# Patient Record
Sex: Male | Born: 1980 | Race: White | Hispanic: No | Marital: Single | Smoking: Never smoker
Health system: Southern US, Community
[De-identification: ages and names within clinical notes are randomized; demographics above are authoritative.]

## PROBLEM LIST (undated history)

## (undated) DIAGNOSIS — B019 Varicella without complication: Secondary | ICD-10-CM

## (undated) DIAGNOSIS — K921 Melena: Secondary | ICD-10-CM

## (undated) HISTORY — PX: WISDOM TOOTH EXTRACTION: SHX21

## (undated) HISTORY — DX: Melena: K92.1

## (undated) HISTORY — DX: Varicella without complication: B01.9

---

## 2015-08-25 ENCOUNTER — Encounter: Payer: Self-pay | Admitting: Family Medicine

## 2015-08-25 ENCOUNTER — Ambulatory Visit (INDEPENDENT_AMBULATORY_CARE_PROVIDER_SITE_OTHER): Payer: Managed Care, Other (non HMO) | Admitting: Family Medicine

## 2015-08-25 ENCOUNTER — Ambulatory Visit (INDEPENDENT_AMBULATORY_CARE_PROVIDER_SITE_OTHER)
Admission: RE | Admit: 2015-08-25 | Discharge: 2015-08-25 | Disposition: A | Discharge status: Home / Self Care | Payer: Managed Care, Other (non HMO) | Source: Ambulatory Visit | Attending: Family Medicine | Admitting: Family Medicine

## 2015-08-25 VITALS — BP 120/78 | HR 72 | Wt 189.0 lb

## 2015-08-25 DIAGNOSIS — M5416 Radiculopathy, lumbar region: Secondary | ICD-10-CM

## 2015-08-25 MED ORDER — PREDNISONE 50 MG PO TABS: 50.0000 mg | ORAL_TABLET | Freq: Every day | ORAL | Status: DC

## 2015-08-25 MED ORDER — GABAPENTIN 100 MG PO CAPS: 200.0000 mg | ORAL_CAPSULE | Freq: Every day | ORAL | Status: DC

## 2015-08-25 NOTE — Assessment & Plan Note (Signed)
Patient is somewhat of a lumbar radiculopathy. Was given prednisone as well as gabapentin. X-rays pending. We discussed range of motion exercises and patient wear athletic trainer to learn him in greater detail. We discussed this worsening symptoms patient is a seek medical attention immediately. Patient otherwise will come back in 2-3 weeks for further evaluation and treatment.

## 2015-08-25 NOTE — Progress Notes (Signed)
Tawana ScaleZach Burnie Hank D.O. [facility] [address] [place], [place] [address] Phone: [phone] Subjective:    CC: Low back pain with radiation down the right leg  [name]:Subjective Philip Diaz is a 35 y.o. male coming in with complaint of backpain. Patient states and he has had this intermittent for approximately 1 year. Over the course last several weeks on seems to be worsening. Patient to the point is unable to work down her doing any of his activities outside of regular daily activities. Patient states that he is also noticing a dull throbbing aching pain that seems to be going down the posterior lateral aspects of the right leg. Disc of past his knee. States that now it seems to be more constant. Denies any numbness associated with it or any type or weakness. Patient states if he moves in certain directions though there is almost a burning sensation in the leg. Denies any bowel or bladder problems. Denies any fevers chills or any abnormal weight loss. Rates the severity of pain a 7 out of 10. Does respond to ibuprofen but he is taking a very regularly.     Past Medical History  Diagnosis Date  . Chicken pox   . Blood in stool    Past Surgical History  Procedure Laterality Date  . Wisdom tooth extraction Bilateral     1999   Social History   Social History  . Marital Status: Single    Spouse Name: N/A  . Number of Children: N/A  . Years of Education: N/A   Social History Main Topics  . Smoking status: Not on file  . Smokeless tobacco: Never Used  . Alcohol Use: Yes  . Drug Use: No  . Sexual Activity: Not on file   Other Topics Concern  . Not on file   Social History Narrative  . No narrative on file   Allergies not on file Family History  Problem Relation Age of Onset  . Cancer Mother   . Hypertension Mother   . Cancer Father   . Hypertension Father   . Alcohol abuse Maternal Grandmother   . Alcohol abuse Maternal Grandfather   . Alcohol abuse  Paternal Grandmother   . Alcohol abuse Paternal Grandfather     Past medical history, social, surgical and family history all reviewed in electronic medical record.  No pertanent information unless stated regarding to the chief complaint.   Review of Systems: No headache, visual changes, nausea, vomiting, diarrhea, constipation, dizziness, abdominal pain, skin rash, fevers, chills, night sweats, weight loss, swollen lymph nodes, body aches, joint swelling, muscle aches, chest pain, shortness of breath, mood changes.   Objective Blood pressure 120/78, pulse 72, weight 189 lb (85.73 kg).  General: No apparent distress alert and oriented x3 mood and affect normal, dressed appropriately.  HEENT: Pupils equal, extraocular movements intact  Respiratory: Patient's speak in full sentences and does not appear short of breath  Cardiovascular: No lower extremity edema, non tender, no erythema  Skin: Warm dry intact with no signs of infection or rash on extremities or on axial skeleton.  Abdomen: Soft nontender  Neuro: Cranial nerves II through XII are intact, neurovascularly intact in all extremities with 2+ DTRs and 2+ pulses.  Lymph: No lymphadenopathy of posterior or anterior cervical chain or axillae bilaterally.  Gait normal with good balance and coordination.  MSK:  Non tender with full range of motion and good stability and symmetric strength and tone of shoulders, elbows, wrist,  knee and  ankles bilaterally.  Back Exam:  Inspection: Unremarkable  Motion: Flexion 25 deg, Extension 25 deg, Side Bending to 35 deg bilaterally,  Rotation to 45 deg bilaterally  SLR laying: Positive right side XSLR laying: Positive with pain on the right side Palpable tenderness: Tender over the paraspinal musculature on the L4-L5 on the right side. FABER: negative. Sensory change: Gross sensation intact to all lumbar and sacral dermatomes.  Reflexes: 2+ at both patellar tendons, 2+ at achilles tendons, Babinski's  downgoing.  Strength at foot  Plantar-flexion: 5/5 Dorsi-flexion: 5/5 Eversion: 5/5 Inversion: 5/5  Leg strength  Quad: 5/5 Hamstring: 5/5 Hip flexor: 5/5 Hip abductors: 4/5 but symmetric Gait unremarkable.  Procedure note 97110; 15 minutes spent for Therapeutic exercises as stated in above notes.  This included exercises focusing on stretching, strengthening, with significant focus on eccentric aspects.  Low back exercises that included:  Pelvic tilt/bracing instruction to focus on control of the pelvic girdle and lower abdominal muscles  Glute strengthening exercises, focusing on proper firing of the glutes without engaging the low back muscles Proper stretching techniques for maximum relief for the hamstrings, hip flexors, low back and some rotation where tolerated Proper technique shown and discussed handout in great detail with ATC.  All questions were discussed and answered.     Impression and Recommendations:     This case required medical decision making of moderate complexity.      Note: This dictation was prepared with Dragon dictation along with smaller phrase technology. Any transcriptional errors that result from this process are unintentional.

## 2015-08-25 NOTE — Patient Instructions (Signed)
Goo see you Ice is your friend Ice 20 minutes 2 times daily. Usually after activity and before bed. Exercises 3 times a week.  If any worsening pain then stop Prednisone daily for 5 days Gabapentin 100mg  for 3 nights then 200mg  thereafter.  Avoid any running or jumping for sure See me again in within 2-3 weeks and we will see how you are doing and if we need anything likel manipulation or consider physical therapy.

## 2015-09-14 ENCOUNTER — Encounter: Payer: Self-pay | Admitting: Family Medicine

## 2015-09-18 ENCOUNTER — Ambulatory Visit (INDEPENDENT_AMBULATORY_CARE_PROVIDER_SITE_OTHER): Payer: Managed Care, Other (non HMO) | Admitting: Family Medicine

## 2015-09-18 VITALS — BP 122/80 | HR 68 | Wt 187.0 lb

## 2015-09-18 DIAGNOSIS — M9902 Segmental and somatic dysfunction of thoracic region: Secondary | ICD-10-CM | POA: Diagnosis not present

## 2015-09-18 DIAGNOSIS — M9903 Segmental and somatic dysfunction of lumbar region: Secondary | ICD-10-CM | POA: Diagnosis not present

## 2015-09-18 DIAGNOSIS — M5416 Radiculopathy, lumbar region: Secondary | ICD-10-CM

## 2015-09-18 DIAGNOSIS — M9904 Segmental and somatic dysfunction of sacral region: Secondary | ICD-10-CM | POA: Diagnosis not present

## 2015-09-18 DIAGNOSIS — M999 Biomechanical lesion, unspecified: Secondary | ICD-10-CM | POA: Insufficient documentation

## 2015-09-18 MED ORDER — GABAPENTIN 300 MG PO CAPS: 300.0000 mg | ORAL_CAPSULE | Freq: Every day | ORAL | Status: DC

## 2015-09-18 MED ORDER — TIZANIDINE HCL 4 MG PO TABS: 4.0000 mg | ORAL_TABLET | Freq: Three times a day (TID) | ORAL | Status: DC | PRN

## 2015-09-18 NOTE — Patient Instructions (Addendum)
Good to see you  Ice is your friend  Increase gabapentin to 300mg  at night Zanaflex up to 2 times daily as needed as a muscle relaxer Physical therapy will be calling you and go as much as you feel it is needed.  Tried manipulation and will see if it helps Send me a message in 10 days and see me again in 3 weeks.  We may need to consider MRI or a EMG.

## 2015-09-18 NOTE — Progress Notes (Signed)
Tawana ScaleZach Smith D.O. [facility] [address] [place], [place] [address] Phone: [phone] Subjective:    CC: Low back pain with radiation down the right leg follow-up  [name]:Subjective Philip Diaz is a 35 y.o. male coming in with complaint of backpain. Patient was seen previously and was having signs of radicular symptoms. Concern for lumbar radiculopathy. Was sent for x-rays that were independently visualized by me and showed no significant bony normality. Patient states that the prednisone may have helped some but once he was off the medication it seemed to come back. Mild improvement with the gabapentin. Has not tried any of the other over-the-counter medicines. When he tries to do the piriformis stretch seems to worsen the radiation to the lateral aspect awake around the ankle. Has not been working out regularly secondary to the pain.     Past Medical History  Diagnosis Date  . Chicken pox   . Blood in stool    Past Surgical History  Procedure Laterality Date  . Wisdom tooth extraction Bilateral     1999   Social History   Social History  . Marital Status: Single    Spouse Name: N/A  . Number of Children: N/A  . Years of Education: N/A   Social History Main Topics  . Smoking status: Not on file  . Smokeless tobacco: Never Used  . Alcohol Use: Yes  . Drug Use: No  . Sexual Activity: Not on file   Other Topics Concern  . Not on file   Social History Narrative  . No narrative on file   Not on File Family History  Problem Relation Age of Onset  . Cancer Mother   . Hypertension Mother   . Cancer Father   . Hypertension Father   . Alcohol abuse Maternal Grandmother   . Alcohol abuse Maternal Grandfather   . Alcohol abuse Paternal Grandmother   . Alcohol abuse Paternal Grandfather     Past medical history, social, surgical and family history all reviewed in electronic medical record.  No pertanent information unless stated regarding to the chief  complaint.   Review of Systems: No headache, visual changes, nausea, vomiting, diarrhea, constipation, dizziness, abdominal pain, skin rash, fevers, chills, night sweats, weight loss, swollen lymph nodes, body aches, joint swelling, muscle aches, chest pain, shortness of breath, mood changes.   Objective There were no vitals taken for this visit.  General: No apparent distress alert and oriented x3 mood and affect normal, dressed appropriately.  HEENT: Pupils equal, extraocular movements intact  Respiratory: Patient's speak in full sentences and does not appear short of breath  Cardiovascular: No lower extremity edema, non tender, no erythema  Skin: Warm dry intact with no signs of infection or rash on extremities or on axial skeleton.  Abdomen: Soft nontender  Neuro: Cranial nerves II through XII are intact, neurovascularly intact in all extremities with 2+ DTRs and 2+ pulses.  Lymph: No lymphadenopathy of posterior or anterior cervical chain or axillae bilaterally.  Gait normal with good balance and coordination.  MSK:  Non tender with full range of motion and good stability and symmetric strength and tone of shoulders, elbows, wrist,  knee and ankles bilaterally.  Back Exam:  Inspection: Unremarkable  Motion: Flexion 25 deg, Extension 25 deg, Side Bending to 35 deg bilaterally,  Rotation to 45 deg bilaterally  SLR laying: Positive right side still present with mild worsening XSLR laying: Positive with pain on the right side Palpable tenderness: Tender over the  paraspinal musculature on the L4-L5 on the right side. FABER: negative. Sensory change: Gross sensation intact to all lumbar and sacral dermatomes.  Reflexes: 2+ at both patellar tendons, 2+ at achilles tendons, Babinski's downgoing.  Strength at foot  Strength of the foot seems 4+ on the right side compared to 5 out of 5 strength on the left Leg strength  Quad: 5/5 Hamstring: 5/5 Hip flexor: 5/5 Hip abductors: 4/5 but  symmetric Gait unremarkable.  Osteopathic findings T3 extended rotated and side bent right L2 flexed rotated and side bent right Sacrum left on left    Impression and Recommendations:     This case required medical decision making of moderate complexity.      Note: This dictation was prepared with Dragon dictation along with smaller phrase technology. Any transcriptional errors that result from this process are unintentional.

## 2015-09-18 NOTE — Assessment & Plan Note (Signed)
Continues to have signs and symptoms of radiculopathy. Visit secondary to compression at the piriformis muscle or from the back itself. X-rays were normal. Discussed with patient about possible injection of the piriformis  INCREASE GABAPENTIN TO 300 MG. MUSCLE RELAXER PRESCRIBED. ENCOURAGE PATIENT TO DO MORE THE EXERCISES AND HE DECLINED FORMAL PHYSICAL THERAPY TODAY. PATIENT DID ATTEMPT TO RESPOND TO OSTEOPATHIC MANIPULATION. FOLLOW-UP AGAIN IN 4 WEEKS

## 2015-09-18 NOTE — Assessment & Plan Note (Signed)
Decision today to treat with OMT was based on Physical Exam  After verbal consent patient was treated with HVLA, ME techniques in thoracic, lumbar and sacral areas  Patient tolerated the procedure well with improvement in symptoms  Patient given exercises, stretches and lifestyle modifications  See medications in patient instructions if given  Patient will follow up in 4 weeks    

## 2015-09-21 ENCOUNTER — Ambulatory Visit: Payer: Managed Care, Other (non HMO) | Attending: Family Medicine | Admitting: Physical Therapy

## 2015-09-21 DIAGNOSIS — M6281 Muscle weakness (generalized): Secondary | ICD-10-CM | POA: Diagnosis present

## 2015-09-21 DIAGNOSIS — M5441 Lumbago with sciatica, right side: Secondary | ICD-10-CM | POA: Diagnosis present

## 2015-09-21 NOTE — Therapy (Signed)
[facility] [address] [place], [place], [address] Phone: [phone]   Fax:  [phone]  Physical Therapy Evaluation  Patient Details  Name: Philip OxfordMarshall Oats MRN: [id number] Date of Birth: September 06, 1980 Referring Provider: Terrilee Filessmith, zach  Encounter Date: 09/21/2015      PT End of Session - 09/21/15 0837    Visit Number 1   Date for PT Re-Evaluation 11/16/15   PT Start Time 0800   PT Stop Time 0837   PT Time Calculation (min) 37 min   Activity Tolerance Patient tolerated treatment well   Behavior During Therapy [facility] for tasks assessed/performed      Past Medical History  Diagnosis Date  . Chicken pox   . Blood in stool     Past Surgical History  Procedure Laterality Date  . Wisdom tooth extraction Bilateral     1999    There were no vitals filed for this visit.       Subjective Assessment - 09/21/15 0805    Pertinent History many instances of low back strains            [facility] PT Assessment - 09/21/15 0001    Assessment   Medical Diagnosis low back pain with sciatica   Referring Provider Terrilee Filessmith, zach   Next MD Visit july 24   Prior Therapy pt given HEP for stretching and strengthening by MD   Precautions   Precautions None   Restrictions   Weight Bearing Restrictions No   Balance Screen   Has the patient fallen in the past 6 months No   Prior Function   Level of Independence Independent   Observation/Other Assessments   Focus on Therapeutic Outcomes (FOTO)  49% limited   ROM / Strength   AROM / PROM / Strength AROM;Strength   AROM   Overall AROM Comments lumbar flexion limited 50%, all other motions [facility]   Strength   Overall Strength Comments hip flex, abd, ext all 4/5 on Rt, multifidus 3/5   Palpation   SI assessment  tightness bilat with PA glides   Palpation comment no tightness palpable in piriformis bilat   Special Tests    Special Tests Lumbar   Lumbar Tests FABER test;Slump  Test;Straight Leg Raise   FABER test   findings Negative   Side --  bilat   Slump test   Findings Positive   Side --  bilat   Straight Leg Raise   Findings Positive   Side  Right   Ambulation/Gait   Ambulation/Gait --  antalgic gait with decreased knee flex on Rt                   [facility] Adult PT Treatment/Exercise - 09/21/15 0001    Manual Therapy   Manual Therapy Joint mobilization   Joint Mobilization SI jt grade II-III for decreasing pain and increasing mobility                PT Education - 09/21/15 0834    Education provided Yes   Education Details HEP for nerve glides for sciatic and piriformis stretch.   Person(s) Educated Patient   Methods Explanation;Demonstration;Handout   Comprehension Verbalized understanding;Returned demonstration          PT Short Term Goals - 09/21/15 0840    PT SHORT TERM GOAL #1   Title pt will be independent with initial HEP   Time 4   Period Weeks   Status New   PT SHORT TERM  GOAL #2   Title Pt will report 25% reduction in pain with getting in/out of car   Time 4   Period Weeks   Status New   PT SHORT TERM GOAL #3   Title Pt will improve lumbar flexion ROM by 25%   Time 4   Period Weeks   Status New           PT Long Term Goals - 09/21/15 0841    PT LONG TERM GOAL #1   Title Pt will improve FOTO to <30% to demonstrate improved functional mobility   Time 8   Period Weeks   Status New   PT LONG TERM GOAL #2   Title Pt will be independent with advanced HEP   Time 8   Period Weeks   Status New   PT LONG TERM GOAL #3   Title Pt will reduce pain by 50% with daily activities   Time 8   Period Weeks   Status New   PT LONG TERM GOAL #4   Title Pt will demo core and multifidus strength 4+/5 to improve ability to perform daily activities with decreased pain   Time 8   Period Weeks   Status New               Plan - 09/21/15 1610    Clinical Impression Statement Pt presents with burning  pain down Rt LE with flexion movements which limit his ability to perform daily tasks without pain. Pt will benefit from skilled PT to address deficits and improve pain free functional mobility.   Rehab Potential Good   PT Frequency 2x / week   PT Duration 8 weeks   PT Treatment/Interventions Iontophoresis /ml Dexamethasone;ADLs/Self Care Home Management;Electrical Stimulation;DME Instruction;Cryotherapy;Biofeedback;Traction;Ultrasound;Moist Heat;Functional mobility training;Gait training;Therapeutic activities;Therapeutic exercise;Balance training;Patient/family education;Taping;Dry needling;Manual techniques;Passive range of motion   PT Next Visit Plan assess HEP, jt mobilitzation, core/back strengthening   PT Home Exercise Plan piriformis stretch, nerve glides   Consulted and Agree with Plan of Care Patient      Patient will benefit from skilled therapeutic intervention in order to improve the following deficits and impairments:  Pain, Hypomobility, Difficulty walking, Decreased strength  Visit Diagnosis: Right-sided low back pain with right-sided sciatica - Plan: PT plan of care cert/re-cert  Muscle weakness (generalized) - Plan: PT plan of care cert/re-cert     Problem List Patient Active Problem List   Diagnosis Date Noted  . Nonallopathic lesion of thoracic region 09/18/2015  . Nonallopathic lesion of lumbar region 09/18/2015  . Nonallopathic lesion of sacral region 09/18/2015  . Lumbar radiculopathy 08/25/2015    Reggy Eye, PT, DPT  09/21/2015, 8:46 AM  [facility] [address] [place], [place], [address] Phone: [phone]   Fax:  [phone]  Name: Augustino Savastano MRN: [id number] Date of Birth: [birth year]

## 2015-09-26 ENCOUNTER — Ambulatory Visit: Payer: Managed Care, Other (non HMO)

## 2015-09-26 DIAGNOSIS — M5441 Lumbago with sciatica, right side: Secondary | ICD-10-CM | POA: Diagnosis not present

## 2015-09-26 DIAGNOSIS — M6281 Muscle weakness (generalized): Secondary | ICD-10-CM

## 2015-09-26 NOTE — Patient Instructions (Signed)
On Elbows (Prone)  Rise up on elbows as high as possible, keeping hips on floor. Hold 1-5 minutes  Do _3-5___ sessions per day.  Press-Up  Press upper body upward, keeping hips in contact with floor. Keep lower back and buttocks relaxed. Hold _3-5___ seconds. Repeat ____ times per set. Do ____ sessions per day.                     Backward Bend (Standing)   Arch backward to make hollow of back deeper. Hold _5___ seconds. Repeat __10__ times per set. Do ____ sessions per day. Copyright  [employer]. All rights reserved.    If you have back pain and the pain travels to your hips and/or legs, please STOP the exercises  If you already have leg pain and the leg pain gets worse, STOP and let your therapist know at your next visit  Occasionally, when you abolish leg pain, you may have a temporary increase in low back pain.  This can be a normal reaction and the back pain should eventually get better.  However, if the pain is too significant to exercise, please STOP the exercises and let your therapist know at your next visit.     Lifting Principles  .Maintain proper posture and head alignment. .Slide object as close as possible before lifting. .Move obstacles out of the way. .Test before lifting; ask for help if too heavy. .Tighten stomach muscles without holding breath. .Use smooth movements; do not jerk. .Use legs to do the work, and pivot with feet. .Distribute the work load symmetrically and close to the center of trunk. .Push instead of pull whenever possible.   Squat down and hold basket close to stand. Use leg muscles to do the work.    Avoid twisting or bending back. Pivot around using foot movements, and bend at knees if needed when reaching for articles.        Getting Into / Out of Bed   Lower self to lie down on one side by raising legs and lowering head at the same time. Use arms to assist moving without twisting. Bend both knees to roll onto  back if desired. To sit up, start from lying on side, and use same move-ments in reverse. Keep trunk aligned with legs.    Shift weight from front foot to back foot as item is lifted off shelf.    When leaning forward to pick object up from floor, extend one leg out behind. Keep back straight. Hold onto a sturdy support with other hand.      Sit upright, head facing forward. Try using a roll to support lower back. Keep shoulders relaxed, and avoid rounded back. Keep hips level with knees. Avoid crossing legs for long periods.    [facility] [address], [address] [place], [place] [address] Phone # [phone] Fax [phone]

## 2015-09-26 NOTE — Therapy (Signed)
[facility] [address], [address] [place], [place], [address] Phone: [phone]   Fax:  [phone]  Physical Therapy Treatment  Patient Details  Name: Philip Diaz MRN: [id number] Date of Birth: 1980/08/06 Referring Provider: Terrilee Files  Encounter Date: 09/26/2015      PT End of Session - 09/26/15 0751    Visit Number 2   Date for PT Re-Evaluation 11/16/15   PT Start Time 0730   PT Stop Time 0820   PT Time Calculation (min) 50 min   Activity Tolerance Patient tolerated treatment well   Behavior During Therapy [facility] for tasks assessed/performed      Past Medical History  Diagnosis Date  . Chicken pox   . Blood in stool     Past Surgical History  Procedure Laterality Date  . Wisdom tooth extraction Bilateral     1999    There were no vitals filed for this visit.      Subjective Assessment - 09/26/15 0734    Subjective Doing OK with exercise.   Currently in Pain? Yes   Pain Score 7    Pain Location Leg   Pain Orientation Right   Pain Descriptors / Indicators Burning   Pain Radiating Towards Rt LE    Pain Onset More than a month ago   Pain Frequency Constant   Aggravating Factors  stretching, putting on shoes and socks on, in/out of car, sit to stand   Pain Relieving Factors meds                         [facility] Adult PT Treatment/Exercise - 09/26/15 0001    Exercises   Exercises Lumbar   Lumbar Exercises: Standing   Other Standing Lumbar Exercises standing extension x10   Lumbar Exercises: Prone   Straight Leg Raise 20 reps   Other Prone Lumbar Exercises prone on elbows, prone press ups   Modalities   Modalities Traction   Traction   Type of Traction Lumbar   Min (lbs) 50   Max (lbs) 90   Hold Time 60   Rest Time 20   Time 15                PT Education - 09/26/15 0750    Education provided Yes   Education Details [employer], [employer] operational officer)  Educated Patient   Methods Explanation;Demonstration;Handout   Comprehension Verbalized understanding;Returned demonstration          PT Short Term Goals - 09/26/15 0739    PT SHORT TERM GOAL #1   Title pt will be independent with initial HEP   Time 4   Period Weeks   Status On-going   PT SHORT TERM GOAL #2   Title Pt will report 25% reduction in pain with getting in/out of car   Time 4   Period Weeks   Status On-going   PT SHORT TERM GOAL #3   Title Pt will improve lumbar flexion ROM by 25%   Time 4   Period Weeks   Status On-going           PT Long Term Goals - 09/21/15 0841    PT LONG TERM GOAL #1   Title Pt will improve FOTO to <30% to demonstrate improved functional mobility   Time 8   Period Weeks   Status New   PT LONG TERM GOAL #2   Title Pt will be independent with advanced  HEP   Time 8   Period Weeks   Status New   PT LONG TERM GOAL #3   Title Pt will reduce pain by 50% with daily activities   Time 8   Period Weeks   Status New   PT LONG TERM GOAL #4   Title Pt will demo core and multifidus strength 4+/5 to improve ability to perform daily activities with decreased pain   Time 8   Period Weeks   Status New               Plan - 09/26/15 0740    Clinical Impression Statement Pt with only 1 session after evaluation.  Pt is independent in initial HEP for gluteal flexiblity and nerve glides.  PT tried traction today and will assess response next session.  Pt with continued Rt gluteal and LE radiculopathy and will continue to benefit from skilled PT for traction, flexiblity and manual  therapy to Rt gluteals.     Rehab Potential Good   PT Frequency 2x / week   PT Duration 8 weeks   PT Treatment/Interventions Iontophoresis 4mg /ml Dexamethasone;ADLs/Self Care Home Management;Electrical Stimulation;DME Instruction;Cryotherapy;Biofeedback;Traction;Ultrasound;Moist Heat;Functional mobility training;Gait training;Therapeutic activities;Therapeutic  exercise;Balance training;Patient/family education;Taping;Dry needling;Manual techniques;Passive range of motion   PT Next Visit Plan assess HEP, jt mobilitzation, core/back strengthening, continue traction if helpful   Consulted and Agree with Plan of Care Patient      Patient will benefit from skilled therapeutic intervention in order to improve the following deficits and impairments:  Pain, Hypomobility, Difficulty walking, Decreased strength  Visit Diagnosis: Right-sided low back pain with right-sided sciatica  Muscle weakness (generalized)     Problem List Patient Active Problem List   Diagnosis Date Noted  . Nonallopathic lesion of thoracic region 09/18/2015  . Nonallopathic lesion of lumbar region 09/18/2015  . Nonallopathic lesion of sacral region 09/18/2015  . Lumbar radiculopathy 08/25/2015     Lorrene ReidKelly Tanis Burnley, PT 09/26/2015 8:03 AM  [facility] [address], [address] [place], [place], [address] Phone: [phone]   Fax:  [phone]  Name: Philip OxfordMarshall Mosley MRN: [id number] Date of Birth: [birth year]

## 2015-09-28 ENCOUNTER — Ambulatory Visit: Payer: Managed Care, Other (non HMO)

## 2015-09-28 DIAGNOSIS — M5441 Lumbago with sciatica, right side: Secondary | ICD-10-CM

## 2015-09-28 DIAGNOSIS — M6281 Muscle weakness (generalized): Secondary | ICD-10-CM

## 2015-09-28 NOTE — Therapy (Signed)
[facility] [address], [address] [place], [place], [address] Phone: [phone]   Fax:  [phone]  Physical Therapy Treatment  Patient Details  Name: Philip OxfordMarshall Haak MRN: [id number] Date of Birth: 10-Dec-1980 Referring Provider: Terrilee Filessmith, zach  Encounter Date: 09/28/2015      PT End of Session - 09/28/15 0802    Visit Number 3   Date for PT Re-Evaluation 11/16/15   PT Start Time 0732   PT Stop Time 0820   PT Time Calculation (min) 48 min   Activity Tolerance Patient tolerated treatment well   Behavior [facility] Therapy [facility] for tasks assessed/performed      Past Medical History  Diagnosis Date  . Chicken pox   . Blood in stool     Past Surgical History  Procedure Laterality Date  . Wisdom tooth extraction Bilateral     1999    There were no vitals filed for this visit.      Subjective Assessment - 09/28/15 0735    Subjective Not sure if any change after traction.   Patient Stated Goals decrease pain   Currently in Pain? Yes   Pain Score 7    Pain Location Leg   Pain Orientation Right   Pain Descriptors / Indicators Burning   Pain Type Neuropathic pain   Pain Onset More than a month ago   Pain Frequency Constant   Aggravating Factors  stretching, putting on shoes and socks, in/out of the car, sit to stand   Pain Relieving Factors medication                         [facility] Adult PT Treatment/Exercise - 09/28/15 0001    Lumbar Exercises: Standing   Other Standing Lumbar Exercises standing extension x10   Lumbar Exercises: Prone   Straight Leg Raise 20 reps   Other Prone Lumbar Exercises prone on elbows, prone press ups   Modalities   Modalities Traction   Traction   Type of Traction Lumbar   Min (lbs) 50   Max (lbs) 100   Hold Time 60   Rest Time 20   Time 15   Manual Therapy   Manual Therapy Joint mobilization   Manual therapy comments PA mobs L1-5 grade 3   Joint Mobilization SI  jt grade II-III for decreasing pain and increasing mobility                  PT Short Term Goals - 09/26/15 0739    PT SHORT TERM GOAL #1   Title pt will be independent with initial HEP   Time 4   Period Weeks   Status On-going   PT SHORT TERM GOAL #2   Title Pt will report 25% reduction in pain with getting in/out of car   Time 4   Period Weeks   Status On-going   PT SHORT TERM GOAL #3   Title Pt will improve lumbar flexion ROM by 25%   Time 4   Period Weeks   Status On-going           PT Long Term Goals - 09/21/15 0841    PT LONG TERM GOAL #1   Title Pt will improve FOTO to <30% to demonstrate improved functional mobility   Time 8   Period Weeks   Status New   PT LONG TERM GOAL #2   Title Pt will be independent with advanced HEP   Time 8  Period Weeks   Status New   PT LONG TERM GOAL #3   Title Pt will reduce pain by 50% with daily activities   Time 8   Period Weeks   Status New   PT LONG TERM GOAL #4   Title Pt will demo core and multifidus strength 4+/5 to improve ability to perform daily activities with decreased pain   Time 8   Period Weeks   Status New             Patient will benefit from skilled therapeutic intervention in order to improve the following deficits and impairments:     Visit Diagnosis: Right-sided low back pain with right-sided sciatica  Muscle weakness (generalized)     Problem List Patient Active Problem List   Diagnosis Date Noted  . Nonallopathic lesion of thoracic region 09/18/2015  . Nonallopathic lesion of lumbar region 09/18/2015  . Nonallopathic lesion of sacral region 09/18/2015  . Lumbar radiculopathy 08/25/2015     Philip Diaz, PT 09/28/2015 8:05 AM  [facility] [address] [place], [place], [address] Phone: [phone]   Fax:  [phone]  Name: Philip Diaz MRN: [id number] Date of Birth: [birth year]

## 2015-10-03 ENCOUNTER — Ambulatory Visit: Payer: Managed Care, Other (non HMO)

## 2015-10-03 DIAGNOSIS — M5441 Lumbago with sciatica, right side: Secondary | ICD-10-CM

## 2015-10-03 DIAGNOSIS — M6281 Muscle weakness (generalized): Secondary | ICD-10-CM

## 2015-10-03 NOTE — Therapy (Signed)
[facility] [address], [address] [place], [place], [address] Phone: [phone]   Fax:  [phone]  Physical Therapy Treatment  Patient Details  Name: Philip Diaz MRN: [id number] Date of Birth: [birth year] Referring Provider: Terrilee Files  Encounter Date: 10/03/2015      PT End of Session - 10/03/15 0758    Visit Number 4   Date for PT Re-Evaluation 11/16/15   PT Start Time 0732   PT Stop Time 0815   PT Time Calculation (min) 43 min   Activity Tolerance Patient tolerated treatment well   Behavior During Therapy [facility] for tasks assessed/performed      Past Medical History:  Diagnosis Date  . Blood in stool   . Chicken pox     Past Surgical History:  Procedure Laterality Date  . WISDOM TOOTH EXTRACTION Bilateral    1999    There were no vitals filed for this visit.      Subjective Assessment - 10/03/15 0736    Subjective No change in symptoms.    Patient Stated Goals decrease pain   Currently in Pain? Yes   Pain Score 7    Pain Location Leg   Pain Orientation Right   Pain Descriptors / Indicators Burning   Pain Type Neuropathic pain   Pain Radiating Towards Rt LE   Pain Onset More than a month ago   Pain Frequency Constant   Aggravating Factors  stretching, putting on shoes and socks, in/out of the car, sit to stand   Pain Relieving Factors medication                         [facility] Adult PT Treatment/Exercise - 10/03/15 0001      Lumbar Exercises: Standing   Other Standing Lumbar Exercises standing extension x10     Lumbar Exercises: Prone   Straight Leg Raise 20 reps   Other Prone Lumbar Exercises prone on elbows, prone press ups     Modalities   Modalities Traction     Traction   Type of Traction Lumbar   Min (lbs) 50   Max (lbs) 100   Hold Time 60   Rest Time 20   Time 15     Manual Therapy   Manual Therapy Joint mobilization   Manual therapy comments PA mobs L1-5  grade 3   Joint Mobilization SI jt grade II-III for decreasing pain and increasing mobility                  PT Short Term Goals - 10/03/15 0737      PT SHORT TERM GOAL #1   Title pt will be independent with initial HEP   Time 4   Period Weeks   Status On-going     PT SHORT TERM GOAL #2   Title Pt will report 25% reduction in pain with getting in/out of car   Time 4   Period Weeks   Status On-going           PT Long Term Goals - 09/21/15 0841      PT LONG TERM GOAL #1   Title Pt will improve FOTO to <30% to demonstrate improved functional mobility   Time 8   Period Weeks   Status New     PT LONG TERM GOAL #2   Title Pt will be independent with advanced HEP   Time 8   Period Weeks   Status New  PT LONG TERM GOAL #3   Title Pt will reduce pain by 50% with daily activities   Time 8   Period Weeks   Status New     PT LONG TERM GOAL #4   Title Pt will demo core and multifidus strength 4+/5 to improve ability to perform daily activities with decreased pain   Time 8   Period Weeks   Status New               Plan - 10/03/15 9604    Clinical Impression Statement Pt denies any significant change in symptoms since the start of care.  Pt has been moderately compliant with extension based exercises.  Pt is not able to tolerate nerve glide or flexion based exercise due to reproduction of Rt LE pain.  Pt will see MD on Monday to discuss continued Rt LE symptoms.     Rehab Potential Good   PT Frequency 2x / week   PT Duration 8 weeks   PT Treatment/Interventions Iontophoresis /ml Dexamethasone;ADLs/Self Care Home Management;Electrical Stimulation;DME Instruction;Cryotherapy;Biofeedback;Traction;Ultrasound;Moist Heat;Functional mobility training;Gait training;Therapeutic activities;Therapeutic exercise;Balance training;Patient/family education;Taping;Dry needling;Manual techniques;Passive range of motion   PT Next Visit Plan  jt mobilitzation, core/back  strengthening, continue traction if helpful   Consulted and Agree with Plan of Care Patient      Patient will benefit from skilled therapeutic intervention in order to improve the following deficits and impairments:  Pain, Hypomobility, Difficulty walking, Decreased strength  Visit Diagnosis: Right-sided low back pain with right-sided sciatica  Muscle weakness (generalized)     Problem List Patient Active Problem List   Diagnosis Date Noted  . Nonallopathic lesion of thoracic region 09/18/2015  . Nonallopathic lesion of lumbar region 09/18/2015  . Nonallopathic lesion of sacral region 09/18/2015  . Lumbar radiculopathy 08/25/2015    Lorrene Reid, PT 10/03/15 7:59 AM  [facility] [address], [address] [place], [place], [address] Phone: [phone]   Fax:  [phone]  Name: Philip Diaz MRN: [id number] Date of Birth: Aug 30, 1980

## 2015-10-05 ENCOUNTER — Ambulatory Visit: Payer: Managed Care, Other (non HMO)

## 2015-10-08 NOTE — Progress Notes (Signed)
[facility] [address] [place], [place] [address] Phone: [phone] Subjective:    CC: Low back pain with radiation down the right leg follow-up  [id number]  Philip Diaz is a 35 y.o. male coming in with complaint of backpain. Patient was seen previously and was having signs of radicular symptoms. Concern for lumbar radiculopathy. Was sent for x-rays that were independently visualized by me and showed no significant bony normality. Given Prednisone and gabapentin with mild improvement.  Sent to PT, been 4 visits, also tried OMT.  Patient states    pain seems to be more located in the right buttocks. Seems like if he puts pressure in this area he can have the radicular symptoms going down the lateral aspect the leg. Still there. Patient is been doing the exercises religiously but has not been able to increase his activity. Frustrated with no significant improvement since previous visit.     Past Medical History:  Diagnosis Date  . Blood in stool   . Chicken pox    Past Surgical History:  Procedure Laterality Date  . WISDOM TOOTH EXTRACTION Bilateral    1999   Social History   Social History  . Marital status: Single    Spouse name: N/A  . Number of children: N/A  . Years of education: N/A   Social History Main Topics  . Smoking status: Not on file  . Smokeless tobacco: Never Used  . Alcohol use Yes  . Drug use: No  . Sexual activity: Not on file   Other Topics Concern  . Not on file   Social History Narrative  . No narrative on file   Not on File Family History  Problem Relation Age of Onset  . Cancer Mother   . Hypertension Mother   . Cancer Father   . Hypertension Father   . Alcohol abuse Maternal Grandmother   . Alcohol abuse Maternal Grandfather   . Alcohol abuse Paternal Grandmother   . Alcohol abuse Paternal Grandfather     Past medical history, social, surgical and family history all reviewed in electronic  medical record.  No pertanent information unless stated regarding to the chief complaint.   Review of Systems: No headache, visual changes, nausea, vomiting, diarrhea, constipation, dizziness, abdominal pain, skin rash, fevers, chills, night sweats, weight loss, swollen lymph nodes, body aches, joint swelling, muscle aches, chest pain, shortness of breath, mood changes.   Objective  There were no vitals taken for this visit.  General: No apparent distress alert and oriented x3 mood and affect normal, dressed appropriately.  HEENT: Pupils equal, extraocular movements intact  Respiratory: Patient's speak in full sentences and does not appear short of breath  Cardiovascular: No lower extremity edema, non tender, no erythema  Skin: Warm dry intact with no signs of infection or rash on extremities or on axial skeleton.  Abdomen: Soft nontender  Neuro: Cranial nerves II through XII are intact, neurovascularly intact in all extremities with 2+ DTRs and 2+ pulses.  Lymph: No lymphadenopathy of posterior or anterior cervical chain or axillae bilaterally.  Gait normal with good balance and coordination.  MSK:  Non tender with full range of motion and good stability and symmetric strength and tone of shoulders, elbows, wrist,  knee and ankles bilaterally.  Back Exam:  Inspection: Unremarkable  Motion: Flexion 30 deg, Extension 25 deg, Side Bending to 35 deg bilaterally,  Rotation to 45 deg bilaterally mild improvement in range of motionSLR laying: Positive right  side still present with mild worsening XSLR laying: Positive with pain on the right side Palpable tenderness: Tender over the paraspinal musculature on the L4-L5 on the right side. FABER: negative. Sensory change: Gross sensation intact to all lumbar and sacral dermatomes.  Reflexes: 2+ at both patellar tendons, 2+ at achilles tendons, Babinski's downgoing.  Strength at foot  Strength of the foot seems 4+ on the right side compared to 5 out of  5 strength on the left Leg strength  Quad: 5/5 Hamstring: 5/5 but significant tightness Hip flexor: 5/5 Hip abductors: 4/5 but symmetric Gait unremarkable.  Procedure: Real-time Ultrasound Guided Injection of right piriformisDevice: GE Logiq E  Ultrasound guided injection is preferred based studies that show increased duration, increased effect, greater accuracy, decreased procedural pain, increased response rate, and decreased cost with ultrasound guided versus blind injection.  Verbal informed consent obtained.  Time-out conducted.  Noted no overlying erythema, induration, or other signs of local infection.  Skin prepped in a sterile fashion.  Local anesthesia: Topical Ethyl chloride.  With sterile technique and under real time ultrasound guidance:  With a 21-gauge 2 inch needle patient was injected with a total of 1 mL of 0.5% Marcaine and 1 mL of Kenalog 41 g/dL. Completed without difficulty  Pain immediately resolved suggesting accurate placement of the medication.  Advised to call if fevers/chills, erythema, induration, drainage, or persistent bleeding.  Images permanently stored and available for review in the ultrasound unit.  Impression: Technically successful ultrasound guided injection.    Impression and Recommendations:     This case required medical decision making of moderate complexity.      Note: This dictation was prepared with Dragon dictation along with smaller phrase technology. Any transcriptional errors that result from this process are unintentional.

## 2015-10-09 ENCOUNTER — Encounter: Payer: Self-pay | Admitting: Family Medicine

## 2015-10-09 ENCOUNTER — Ambulatory Visit (INDEPENDENT_AMBULATORY_CARE_PROVIDER_SITE_OTHER): Payer: Managed Care, Other (non HMO) | Admitting: Family Medicine

## 2015-10-09 ENCOUNTER — Other Ambulatory Visit: Payer: Self-pay

## 2015-10-09 VITALS — BP 108/72 | HR 77 | Wt 188.0 lb

## 2015-10-09 DIAGNOSIS — G5701 Lesion of sciatic nerve, right lower limb: Secondary | ICD-10-CM

## 2015-10-09 NOTE — Patient Instructions (Addendum)
Great to see you  We injected your Piriformis today and I really hope this helps Take it easy today and ok to do a little more tomorrow.  Starting next week I really want you to start pushing it and tell me how you are doing.  Send me a note.  See me again in 3 weeks and we can discuss next step including manipulation again or consider another type of injection.

## 2015-10-09 NOTE — Assessment & Plan Note (Signed)
Attempted injection today. Hopefully this will help diagnostically as well as therapeutically. I do feel that there is a possibility for a lumbar radiculopathy secondary to the tightness of the hamstrings as well as the pain over the L5-S1 area. Patient was to hold on any advanced imaging at this point. Continue gabapentin if it is helping. Patient will continue muscle relaxer if needed. Patient will follow-up with me again in 3 weeks. At that time we can consider repeating osteopathic manipulation first possible MRI.

## 2015-10-09 NOTE — Progress Notes (Signed)
Pre visit review using our clinic review tool, if applicable. No additional management support is needed unless otherwise documented below in the visit note. 

## 2015-10-28 NOTE — Progress Notes (Signed)
Tawana ScaleZach Lenville Hibberd D.O. [facility] [address] [place], [place] [address] Phone: [phone] Subjective:    CC: Low back pain with radiation down the right leg follow-up  [name]:Subjective  Philip Diaz is a 35 y.o. male coming in with complaint of backpain. Patient was seen previously and was having signs of radicular symptoms. Concern for lumbar radiculopathy. Was sent for x-rays that were independently visualized by me and showed no significant bony normality. Given Prednisone and gabapentin with mild improvement.  Sent to PT, been 4 visits, also tried OMT.   Pain was also more localized to piriformis and given injection at last exam.  Patient states was pain free 10 days then pain has come back slowly. Not doing the exercises. Stopped the gabapentin as well with no real changes.      Past Medical History:  Diagnosis Date  . Blood in stool   . Chicken pox    Past Surgical History:  Procedure Laterality Date  . WISDOM TOOTH EXTRACTION Bilateral    1999   Social History   Social History  . Marital status: Single    Spouse name: N/A  . Number of children: N/A  . Years of education: N/A   Social History Main Topics  . Smoking status: Never Smoker  . Smokeless tobacco: Never Used  . Alcohol use Yes  . Drug use: No  . Sexual activity: Not Asked   Other Topics Concern  . None   Social History Narrative  . None   Not on File Family History  Problem Relation Age of Onset  . Cancer Mother   . Hypertension Mother   . Cancer Father   . Hypertension Father   . Alcohol abuse Maternal Grandmother   . Alcohol abuse Maternal Grandfather   . Alcohol abuse Paternal Grandmother   . Alcohol abuse Paternal Grandfather     Past medical history, social, surgical and family history all reviewed in electronic medical record.  No pertanent information unless stated regarding to the chief complaint.   Review of Systems: No headache, visual changes, nausea, vomiting,  diarrhea, constipation, dizziness, abdominal pain, skin rash, fevers, chills, night sweats, weight loss, swollen lymph nodes, body aches, joint swelling, muscle aches, chest pain, shortness of breath, mood changes.   Objective  Blood pressure 126/84, pulse (!) 55, weight 185 lb (83.9 kg), SpO2 97 %.  General: No apparent distress alert and oriented x3 mood and affect normal, dressed appropriately.  HEENT: Pupils equal, extraocular movements intact  Respiratory: Patient's speak in full sentences and does not appear short of breath  Cardiovascular: No lower extremity edema, non tender, no erythema  Skin: Warm dry intact with no signs of infection or rash on extremities or on axial skeleton.  Abdomen: Soft nontender  Neuro: Cranial nerves II through XII are intact, neurovascularly intact in all extremities with 2+ DTRs and 2+ pulses.  Lymph: No lymphadenopathy of posterior or anterior cervical chain or axillae bilaterally.  Gait normal with good balance and coordination.  MSK:  Non tender with full range of motion and good stability and symmetric strength and tone of shoulders, elbows, wrist,  knee and ankles bilaterally.  Back Exam:  Inspection: Unremarkable  Motion: Flexion 30 deg, Extension 25 deg, Side Bending to 35 deg bilaterally,  Rotation to 45 deg bilaterally mild improvement in range of motion Negative SLT today  XSLR laying: negative Palpable tenderness: Tender over the paraspinal musculature on the L4-L5 on the right side. FABER: negative. Sensory change:  Gross sensation intact to all lumbar and sacral dermatomes.  Reflexes: 2+ at both patellar tendons, 2+ at achilles tendons, Babinski's downgoing.  Strength at foot  Strength of the foot seems 4+ on the right side compared to 5 out of 5 strength on the left Leg strength  Quad: 5/5 Hamstring: 5/5 but significant tightness Hip flexor: 5/5 Hip abductors: 4/5 but symmetric Gait unremarkable.    Impression and Recommendations:       This case required medical decision making of moderate complexity.      Note: This dictation was prepared with Dragon dictation along with smaller phrase technology. Any transcriptional errors that result from this process are unintentional.

## 2015-10-30 ENCOUNTER — Encounter: Payer: Self-pay | Admitting: Family Medicine

## 2015-10-30 ENCOUNTER — Ambulatory Visit (INDEPENDENT_AMBULATORY_CARE_PROVIDER_SITE_OTHER): Payer: Managed Care, Other (non HMO) | Admitting: Family Medicine

## 2015-10-30 VITALS — BP 126/84 | HR 55 | Wt 185.0 lb

## 2015-10-30 DIAGNOSIS — G5701 Lesion of sciatic nerve, right lower limb: Secondary | ICD-10-CM | POA: Diagnosis not present

## 2015-10-30 NOTE — Patient Instructions (Addendum)
Great to see you  With you improving I think we can hold on any MRI of your back.  Physical therapy will be calling you  Ice is your friend.  Tennis ball in back right pocket Do the exercises If you change mind on MRi of back then call me.  If you want to do PRP or nitro patches give me a call as well.  See me again in 3-4 weeks.

## 2015-10-30 NOTE — Assessment & Plan Note (Signed)
Patient did respond fairly well to the injection. Down approximately 10 days of complete relief. I hope that this is been cause. Patient will start with formal physical therapy, icing, and we'll do the home exercises on a more regular basis. Has a muscle relaxer for breakthrough pain. Patient has not responded significantly well to osteopathic manipulation and forcing symptoms I would consider an MRI of the back. We also did discuss the possibility of PRP or nitroglycerin patches. Return for further evaluation in 3-4 weeks.  Spent  25 minutes with patient face-to-face and had greater than 50% of counseling including as described above in assessment and plan.

## 2015-11-07 ENCOUNTER — Ambulatory Visit: Payer: Managed Care, Other (non HMO) | Attending: Family Medicine | Admitting: Physical Therapy

## 2015-11-07 DIAGNOSIS — M5441 Lumbago with sciatica, right side: Secondary | ICD-10-CM | POA: Insufficient documentation

## 2015-11-07 DIAGNOSIS — M6281 Muscle weakness (generalized): Secondary | ICD-10-CM | POA: Insufficient documentation

## 2015-11-07 NOTE — Therapy (Signed)
[facility] [address] [place], [place], [address] Phone: [phone]   Fax:  [phone]  Physical Therapy Treatment/Recertification  Patient Details  Name: Philip Diaz MRN: [id number] Date of Birth: Sep 15, 1980 Referring Provider: Terrilee Files  Encounter Date: 11/07/2015      PT End of Session - 11/07/15 1952    Visit Number 5   Date for PT Re-Evaluation 01/02/16   PT Start Time 1015   PT Stop Time 1100   PT Time Calculation (min) 45 min   Activity Tolerance Patient tolerated treatment well      Past Medical History:  Diagnosis Date  . Blood in stool   . Chicken pox     Past Surgical History:  Procedure Laterality Date  . WISDOM TOOTH EXTRACTION Bilateral    1999    There were no vitals filed for this visit.      Subjective Assessment - 11/07/15 1017    Subjective Right back, buttock and LE pain.  Had piriformis injection, felt better then had a very active weekend.  Very tender over right lateral trochanteric bursa.  Decreased lower leg symptoms.  I do feel like I'm walking differently, like I"m not bending my knee as much.   Reports difficulty stretching HS in seated position.   Doing hip flexor stretch, piriformis stretch, lumbar rotation, cat/camel    Aggravating Factors  sitting, rising, straighten my leg out, bending , putting on socks/shoes;    Pain Relieving Factors shot helped; gabapentin; don't bend            [facility] PT Assessment - 11/07/15 0001      AROM   Overall AROM Comments lumbar flexion limited 50%, all other motions [facility]     Strength   Overall Strength Comments right pelvic drop with single leg standing     Slump test   Findings Positive     Straight Leg Raise   Findings Positive                     [facility] Adult PT Treatment/Exercise - 11/07/15 0001      Self-Care   Self-Care --  use of lumbar roll when sitting; sitting limit 25 min     Lumbar  Exercises: Standing   Other Standing Lumbar Exercises wall sidegliding right 10x     Lumbar Exercises: Sidelying   Other Sidelying Lumbar Exercises right and left over pillow 2 min each     Lumbar Exercises: Prone   Other Prone Lumbar Exercises prone press ups 10   Other Prone Lumbar Exercises prone press ups with bent knee "roadkill position" 8x  with exhale 8x; with manual overpressure 8x     Manual Therapy   Manual therapy comments PA mobs L1-5 grade 3                PT Education - 11/07/15 1951    Education provided Yes   Education Details resume trial of prone press ups 10x every 2hours with exhale or bent knee roadkill position;  use of lumbar roll;  limit sitting   Person(s) Educated Patient   Methods Explanation;Demonstration   Comprehension Verbalized understanding          PT Short Term Goals - 11/07/15 2008      PT SHORT TERM GOAL #1   Title pt will be independent with initial HEP   Time 4   Period Weeks   Status On-going     PT  SHORT TERM GOAL #2   Title Pt will report 25% reduction in pain with getting in/out of car   Time 4   Period Weeks   Status On-going     PT SHORT TERM GOAL #3   Title Pt will improve lumbar flexion ROM by 25%   Time 4   Period Weeks   Status On-going           PT Long Term Goals - 11/07/15 2008      PT LONG TERM GOAL #1   Title Pt will improve FOTO to <30% to demonstrate improved functional mobility  01/02/16   Time 8   Period Weeks   Status On-going     PT LONG TERM GOAL #2   Title Pt will be independent with advanced HEP   Time 8   Period Weeks   Status On-going     PT LONG TERM GOAL #3   Title Pt will reduce pain by 50% with daily activities   Time 8   Period Weeks   Status On-going     PT LONG TERM GOAL #4   Title Pt will demo core and multifidus strength 4+/5 to improve ability to perform daily activities with decreased pain   Time 8   Period Weeks   Status On-going                Plan - 11/07/15 1953    Clinical Impression Statement The patient returns to PT after 1 month since last visit.  He reports that his right LBP, buttock pain and peripheral right LE symptoms persist.  He has an injection in his piriformis muscle and the patient reports pretty good pain relief until this past [place]playing fris[place], swimming and slept on the floor.  Since that time he has had increased pain especially in his right lateral hip (trochanteric region).   He continues to have neural signs with pain and limited straight leg flexion, slump test and SLR.  Repeated movement testing is inconclusive for directional preference with repeated flexion in supine, extension in lying (and with added overpressure).  The patient has discontinued his previous trial of extensions and he has been stretching his HS and piriformis muscles but feels this increases his pain.  No tenderness in right piriformis muscles.  Gluteus medius weakness noted with pelvic drop with single leg standing.  Recommend 3 day trial of lumbar prone extensions at an increased frequency with added exhalation and bent knee positions to encourage centralization.     Rehab Potential Good   PT Frequency 2x / week   PT Duration 8 weeks   PT Treatment/Interventions Iontophoresis 4mg /ml Dexamethasone;ADLs/Self Care Home Management;Electrical Stimulation;DME Instruction;Cryotherapy;Biofeedback;Traction;Ultrasound;Moist Heat;Functional mobility training;Gait training;Therapeutic activities;Therapeutic exercise;Balance training;Patient/family education;Taping;Dry needling;Manual techniques;Passive range of motion   PT Next Visit Plan assess response to restart of lumbar extension/sagittal plane;  if no response may try lateral techniques;  try neural gliding in sitting if symptoms seem stable;  consider ionto to lateral hip;        Patient will benefit from skilled therapeutic  intervention in order to improve the following deficits and impairments:  Pain, Hypomobility, Difficulty walking, Decreased strength  Visit Diagnosis: Right-sided low back pain with right-sided sciatica - Plan: PT plan of care cert/re-cert  Muscle weakness (generalized) - Plan: PT plan of care cert/re-cert     Problem List Patient Active Problem List   Diagnosis Date Noted  .  Piriformis syndrome of right side 10/09/2015  . Nonallopathic lesion of thoracic region 09/18/2015  . Nonallopathic lesion of lumbar region 09/18/2015  . Nonallopathic lesion of sacral region 09/18/2015  . Lumbar radiculopathy 08/25/2015    Lavinia Sharps, PT 11/07/15 8:17 PM Phone: [phone] Fax: [phone]  Vivien Presto 11/07/2015, 8:17 PM  [facility] [address] [place], [place], [address] Phone: [phone]   Fax:  [phone]  Name: Philip Diaz MRN: [id number] Date of Birth: 09-06-1980

## 2015-11-07 NOTE — Patient Instructions (Signed)
Trial of prone pressups 10x every 2 hours with exhale, bent knee positions   Lavinia SharpsStacy Simpson PT [facility] [address], [address] [place], [place] [address] Phone # [phone] Fax [phone]

## 2015-11-09 ENCOUNTER — Encounter: Payer: Managed Care, Other (non HMO) | Admitting: Physical Therapy

## 2015-11-14 ENCOUNTER — Encounter: Payer: Self-pay | Admitting: Family Medicine

## 2015-11-14 ENCOUNTER — Ambulatory Visit: Payer: Managed Care, Other (non HMO) | Attending: Family Medicine | Admitting: Physical Therapy

## 2015-11-14 DIAGNOSIS — M6281 Muscle weakness (generalized): Secondary | ICD-10-CM | POA: Insufficient documentation

## 2015-11-14 DIAGNOSIS — M5441 Lumbago with sciatica, right side: Secondary | ICD-10-CM | POA: Diagnosis not present

## 2015-11-14 NOTE — Therapy (Signed)
[facility] [address] [place], [place], [address] Phone: [phone]   Fax:  [phone]  Physical Therapy Treatment  Patient Details  Name: Philip OxfordMarshall Moffatt MRN: [id number] Date of Birth: April 25, 1980 Referring Provider: Terrilee Filessmith, zach  Encounter Date: 11/14/2015      PT End of Session - 11/14/15 1640    Visit Number 6   Date for PT Re-Evaluation 01/02/16   PT Start Time 0734   PT Stop Time 0815   PT Time Calculation (min) 41 min   Activity Tolerance Patient limited by pain      Past Medical History:  Diagnosis Date  . Blood in stool   . Chicken pox     Past Surgical History:  Procedure Laterality Date  . WISDOM TOOTH EXTRACTION Bilateral    1999    There were no vitals filed for this visit.      Subjective Assessment - 11/14/15 0737    Subjective I'm about the same.  Last week I was pretty painful but that eased some late in the week.  I've stretched a lot and walking an hour a day.   I was thinking about calling the doctor to schedule an MRI.   Did the prone pressups for a couple of days.  Lateral hip soreness is gone.   Right low back, buttock , lateral thigh and slightly lateral lower leg.     Currently in Pain? Yes   Pain Score 7    Pain Location Back   Pain Orientation Right   Pain Type Neuropathic pain   Aggravating Factors  standing with intentional pressure   Pain Relieving Factors prone leg lifts                   Extensive discussion on centralization vs. Peripheralization; neural gliding; general precautions       [facility] Adult PT Treatment/Exercise - 11/14/15 0001      Lumbar Exercises: Seated   LAQ on Chair Limitations seated neural flossing 10x right and left with cervical flexion and extension      Lumbar Exercises: Prone   Straight Leg Raise 5 reps   Straight Leg Raises Limitations right and left   Other Prone Lumbar Exercises prone press ups 10  with exhale 3x   Other Prone Lumbar Exercises prone press up with belt fixation for overpressure                PT Education - 11/14/15 1640    Education provided Yes   Education Details overpressure with belt fixation and seated neural flossing with precaution   Person(s) Educated Patient   Methods Explanation;Demonstration;Handout   Comprehension Verbalized understanding;Returned demonstration          PT Short Term Goals - 11/14/15 1645      PT SHORT TERM GOAL #1   Title pt will be independent with initial HEP   Status Achieved     PT SHORT TERM GOAL #2   Title Pt will report 25% reduction in pain with getting in/out of car   Time 4   Period Weeks   Status On-going     PT SHORT TERM GOAL #3   Title Pt will improve lumbar flexion ROM by 25%   Time 4   Period Weeks   Status On-going           PT Long Term Goals - 11/14/15 1646      PT LONG TERM GOAL #1   Title  Pt will improve FOTO to <30% to demonstrate improved functional mobility  01/02/16   Time 8   Period Weeks   Status On-going     PT LONG TERM GOAL #2   Title Pt will be independent with advanced HEP   Time 8   Period Weeks   Status On-going     PT LONG TERM GOAL #3   Title Pt will reduce pain by 50% with daily activities   Time 8   Period Weeks   Status On-going     PT LONG TERM GOAL #4   Title Pt will demo core and multifidus strength 4+/5 to improve ability to perform daily activities with decreased pain   Time 8   Period Weeks   Status On-going               Plan - 11/14/15 1641    Clinical Impression Statement The patient continues to have peripheral symptoms although intensity of pain less than last week.  He continues to have + slump test and cross SLR signs.  Added lumbar extensions with overpressure to HEP as well as neural flossing with precautions to discontinue if symptoms increase > 5/10 or last longer than 1 hour.  Patient contemplating calling back MD to schedule MRI.  Therapist  closely monitoring symptoms with all.    PT Next Visit Plan assess response to added overpressure and neural flossing;  if no improvement refer to MD      Patient will benefit from skilled therapeutic intervention in order to improve the following deficits and impairments:     Visit Diagnosis: Right-sided low back pain with right-sided sciatica  Muscle weakness (generalized)     Problem List Patient Active Problem List   Diagnosis Date Noted  . Piriformis syndrome of right side 10/09/2015  . Nonallopathic lesion of thoracic region 09/18/2015  . Nonallopathic lesion of lumbar region 09/18/2015  . Nonallopathic lesion of sacral region 09/18/2015  . Lumbar radiculopathy 08/25/2015   Lavinia Sharps, PT 11/14/15 4:48 PM Phone: [phone] Fax: [phone]  Vivien Presto 11/14/2015, 4:47 PM  [facility] [address], [address] [place], [place], [address] Phone: [phone]   Fax:  [phone]  Name: Achillies Buehl MRN: [id number] Date of Birth: 1980-06-06

## 2015-11-14 NOTE — Patient Instructions (Signed)
Add overpressure to press ups.    Nerve flossing head up/leg up then head down/leg down on right and left 5x.  Symptoms should ease quickly.  3x/day      Lavinia SharpsStacy Simpson PT [facility] [address], [address] [place], [place] [address] Phone # [phone] Fax [phone]

## 2015-11-21 ENCOUNTER — Ambulatory Visit: Payer: Managed Care, Other (non HMO) | Admitting: Physical Therapy

## 2015-11-21 DIAGNOSIS — M5441 Lumbago with sciatica, right side: Secondary | ICD-10-CM | POA: Diagnosis not present

## 2015-11-21 DIAGNOSIS — M6281 Muscle weakness (generalized): Secondary | ICD-10-CM

## 2015-11-21 NOTE — Therapy (Addendum)
[facility] [address] [place], [place], [address] Phone: [phone]   Fax:  [phone]  Physical Therapy Treatment/Discharge Summary  Patient Details  Name: Philip Diaz MRN: [id number] Date of Birth: 1980/07/15 Referring Provider: Charlann Boxer  Encounter Date: 11/21/2015      PT End of Session - 11/21/15 1935    Visit Number 7   Date for PT Re-Evaluation 01/02/16   PT Start Time 0735   PT Stop Time 0802   PT Time Calculation (min) 27 min   Activity Tolerance Patient tolerated treatment well      Past Medical History:  Diagnosis Date  . Blood in stool   . Chicken pox     Past Surgical History:  Procedure Laterality Date  . WISDOM TOOTH EXTRACTION Bilateral    1999    There were no vitals filed for this visit.      Subjective Assessment - 11/21/15 0736    Subjective Patient in 30 min appt slot, arrives 5 min late.  Called MD to request MRI, waiting to be scheduled.  Not as much lower leg pain but still same in buttock and back.   But still daily in occurrence.  Haven't done much flossing but I do about 1/2 hour general stretches.  Does prone press ups only every other day.  Lying on stomach pretty good.    Going to [place] for his brother's wedding.    Currently in Pain? Yes   Pain Score 5    Pain Location Buttocks   Pain Orientation Right   Aggravating Factors  bending; extending leg out straight; sit to stand increases buttock pain; getting in and out of the car   Pain Relieving Factors general stretching                         [facility] Adult PT Treatment/Exercise - 11/21/15 0001      Lumbar Exercises: Seated   LAQ on Chair Limitations seated neural flossing 10x right and left with cervical flexion and extension      Lumbar Exercises: Prone   Straight Leg Raise 5 reps   Straight Leg Raises Limitations multifidi press   Other Prone Lumbar Exercises prone press ups 10  with  exhale 3x   Other Prone Lumbar Exercises prone press ups in "roadkill" position 10x 2     Lumbar Exercises: Quadruped   Single Arm Raise Right;Left;5 reps   Opposite Arm/Leg Raise Right arm/Left leg;Left arm/Right leg;10 reps                PT Education - 11/21/15 0802    Education provided Yes   Education Details lumbar multifidi press; bird dogs   Person(s) Educated Patient   Methods Explanation;Demonstration;Handout   Comprehension Verbalized understanding;Returned demonstration          PT Short Term Goals - 11/21/15 1942      PT SHORT TERM GOAL #1   Title pt will be independent with initial HEP   Status Achieved     PT SHORT TERM GOAL #2   Title Pt will report 25% reduction in pain with getting in/out of car   Time 4   Period Weeks   Status On-going     PT SHORT TERM GOAL #3   Title Pt will improve lumbar flexion ROM by 25%   Time 4   Period Weeks   Status On-going  PT Long Term Goals - 11/21/15 1943      PT LONG TERM GOAL #1   Title Pt will improve FOTO to <30% to demonstrate improved functional mobility  01/02/16   Time 8   Period Weeks   Status On-going     PT LONG TERM GOAL #2   Title Pt will be independent with advanced HEP   Time 8   Period Weeks   Status On-going     PT LONG TERM GOAL #3   Title Pt will reduce pain by 50% with daily activities   Time 8   Period Weeks   Status On-going     PT LONG TERM GOAL #4   Title Pt will demo core and multifidus strength 4+/5 to improve ability to perform daily activities with decreased pain   Time 8   Period Weeks   Status On-going               Plan - 11/21/15 1936    Clinical Impression Statement The patient reports continued right low back and right LE symptoms however he does report decreased distal lower leg frequency and intensity of symptoms.  He is able to perform multifidi muscle activation and core stabilization with some increase in proximal symptoms but no  worse overall.  Continues to have +SLR and cross SLR.  [place] for [place] brother's wedding and we discussed the importance stretch breaks.  The patient scheduling for MRI.  Min progress toward goals.  Will hold PT until after imaging.     PT Next Visit Plan patient to call following MRI and follow up with MD      Patient will benefit from skilled therapeutic intervention in order to improve the following deficits and impairments:     Visit Diagnosis: Right-sided low back pain with right-sided sciatica  Muscle weakness (generalized)     Problem List Patient Active Problem List   Diagnosis Date Noted  . Piriformis syndrome of right side 10/09/2015  . Nonallopathic lesion of thoracic region 09/18/2015  . Nonallopathic lesion of lumbar region 09/18/2015  . Nonallopathic lesion of sacral region 09/18/2015  . Lumbar radiculopathy 08/25/2015    PHYSICAL THERAPY DISCHARGE SUMMARY  Visits from Start of Care: 7  Current functional level related to goals / functional outcomes: The patient improved minimally with physical therapy treatment which included McKenzie ex progression,  Postural education, body mechanics training, core strengthening, neural gliding and manual techniques.   Unable to fully centralize symptoms.  After 7 visits, the patient planned to get an MRI.  He has not called to resume PT and his therapy chart has been inactive for > 2 months.  Will discharge from PT at this time but will be happy to re-initiate with a new order.     Remaining deficits: See clinical impressions above   Education / Equipment: Extensive education Plan: Patient agrees to discharge.  Patient goals were not met. Patient is being discharged due to lack of progress.  ?????       Ruben Im, PT 11/21/15 7:45 PM Phone: [phone] Fax: [phone]  Alvera Singh 11/21/2015, 7:45 PM  [facility] [address],  [place], [place], [address] Phone: [phone]   Fax:  [phone]  Name: Philip Diaz MRN: [id number] Date of Birth: [birth year]

## 2015-11-21 NOTE — Patient Instructions (Addendum)
       Combination (Quadruped)  On hands and knees with towel roll between knees, slowly inhale, and then exhale. Pull navel toward spine, squeeze roll with knees, and tighten pelvic floor. Hold for _5__ seconds. Rest for ___ seconds. Repeat __8_ times. Do _1__ times a day.  Bracing With Arm Raise (Quadruped)  On hands and knees find neutral spine. Tighten pelvic floor and abdominals and hold. Alternately lift arm to shoulder level. Repeat _8__ times. Do _1__ times a day.  Quadruped Alternate Hip Extension   Shift weight to one side and raise opposite leg. Keep trunk steady. __8_ reps per set, _1__ sets per day, _7__ days per week Repeat with other leg.  Bracing With Arm / Leg Raise (Quadruped)  On hands and knees find neutral spine. Tighten pelvic floor and abdominals and hold. Alternating, lift arm to shoulder level and opposite leg to hip level. Repeat ___ times. Do ___ times a day.   Philip SharpsStacy Anael Diaz PT [facility] [address], [address] [place], [place] [address] Phone # [phone] Fax [phone]

## 2015-11-27 NOTE — Telephone Encounter (Signed)
Patient called in about his MRI. He has not heard anything yet from anyone about this. He just wanted to follow up. Thank you.

## 2015-11-28 ENCOUNTER — Ambulatory Visit: Payer: Managed Care, Other (non HMO) | Admitting: Family Medicine

## 2015-12-31 ENCOUNTER — Encounter: Payer: Self-pay | Admitting: Family Medicine

## 2015-12-31 DIAGNOSIS — M5416 Radiculopathy, lumbar region: Secondary | ICD-10-CM

## 2016-01-10 ENCOUNTER — Other Ambulatory Visit: Payer: Managed Care, Other (non HMO)

## 2016-01-12 ENCOUNTER — Ambulatory Visit (INDEPENDENT_AMBULATORY_CARE_PROVIDER_SITE_OTHER): Payer: Managed Care, Other (non HMO) | Admitting: Family Medicine

## 2016-01-12 ENCOUNTER — Encounter: Payer: Self-pay | Admitting: Family Medicine

## 2016-01-12 DIAGNOSIS — M5416 Radiculopathy, lumbar region: Secondary | ICD-10-CM

## 2016-01-12 NOTE — Patient Instructions (Signed)
God to see you  Sorry for the delay  Gustavus Bryantce is your friend

## 2016-01-12 NOTE — Progress Notes (Signed)
Philip ScaleZach Diaz D.O. [facility] [address] [place], [place] [address] Phone: [phone] Subjective:    CC: Low back pain with radiation down the right leg follow-up  [name]:Subjective  Philip Diaz is a 35 y.o. male coming in with complaint of backpain. Patient was seen previously and was having signs of radicular symptoms. Concern for lumbar radiculopathy. Was sent for x-rays that were independently visualized by me and showed no significant bony abnormality.  Patient has even had an injection in the piriformis muscle give him some mild improvement. Patient was having worsening symptoms and was to get a MRI of the lumbar spine. We were unable to order this operatively initially. Patient was to continue conservative therapy and has done formal physical therapy. At this point patient is having worsening symptoms. More radicular symptoms down the leg, seems to be affecting daily activities and waking him up at night. Has failed all other conservative therapy. This includes 12 weeks of formal physical therapy. Patient states some of the numbness seems to be getting better but unfortunately he thinks it is now associated with more weakness. Patient also states that unfortunately now any type of flexion of the lower back causes a severe amount of pain with radiation that seems to go down the leg. If anything patient feels like he has worsening.     Past Medical History:  Diagnosis Date  . Blood in stool   . Chicken pox    Past Surgical History:  Procedure Laterality Date  . WISDOM TOOTH EXTRACTION Bilateral    1999   Social History   Social History  . Marital status: Single    Spouse name: N/A  . Number of children: N/A  . Years of education: N/A   Social History Main Topics  . Smoking status: Never Smoker  . Smokeless tobacco: Never Used  . Alcohol use Yes  . Drug use: No  . Sexual activity: Not Asked   Other Topics Concern  . None   Social History Narrative  .  None   Not on File Family History  Problem Relation Age of Onset  . Cancer Mother   . Hypertension Mother   . Cancer Father   . Hypertension Father   . Alcohol abuse Maternal Grandmother   . Alcohol abuse Maternal Grandfather   . Alcohol abuse Paternal Grandmother   . Alcohol abuse Paternal Grandfather     Past medical history, social, surgical and family history all reviewed in electronic medical record.  No pertanent information unless stated regarding to the chief complaint.   Review of Systems: No headache, visual changes, nausea, vomiting, diarrhea, constipation, dizziness, abdominal pain, skin rash, fevers, chills, night sweats, weight loss, swollen lymph nodes, body aches, joint swelling, muscle aches, chest pain, shortness of breath, mood changes.   Objective  Blood pressure 110/80, pulse 65, height 6\' 1"  (1.854 m), weight 186 lb (84.4 kg), SpO2 97 %.  Systems examined below as of 01/12/16 General: No apparent distress alert and oriented x3 mood and affect normal, dressed appropriately.  HEENT: Pupils equal, extraocular movements intact  Respiratory: Patient's speak in full sentences and does not appear short of breath  Cardiovascular: No lower extremity edema, non tender, no erythema  Skin: Warm dry intact with no signs of infection or rash on extremities or on axial skeleton.  Abdomen: Soft nontender  Neuro: Cranial nerves II through XII are intact, neurovascularly intact in all extremities with 2+ DTRs and 2+ pulses.  Lymph: No lymphadenopathy of  posterior or anterior cervical chain or axillae bilaterally.  Gait normal with good balance and coordination.  MSK:  Non tender with full range of motion and good stability and symmetric strength and tone of shoulders, elbows, wrist,  knee and ankles bilaterally.  Back Exam:  Inspection: Unremarkable  Motion: Flexion 30 deg with worsening worsening symptoms going down the right leg, Extension 15 deg, Side Bending to 35 deg  bilaterally,  Rotation to 45 deg bilaterally mild improvement in range of motion Positive straight leg test on the right side XSLR laying: negative Palpable tenderness: Tender over the paraspinal musculature on the L4-L5 on the right side.  FABER: mild positive.  Sensory change: Gross sensation intact to all lumbar and sacral dermatomes.  Reflexes: 2+ at both patellar tendons, 2+ at achilles tendons, Babinski's downgoing.  Strength at foot  Strength of the foot seems 4 on the right side compared to 5 out of 5 strength on the left worse then previous exam.  Leg strength  Quad: 5/5 Hamstring: 5/5 but significant tightness Hip flexor: 5/5 Hip abductors: 4/5 but symmetric Gait unremarkable.    Impression and Recommendations:     This case required medical decision making of moderate complexity.      Note: This dictation was prepared with Dragon dictation along with smaller phrase technology. Any transcriptional errors that result from this process are unintentional.

## 2016-01-12 NOTE — Assessment & Plan Note (Signed)
Worsening symptoms at this time. We discussed icing regimen and home exercises. He is failed all conservative therapy including formal physical therapy. Having worsening symptoms with radicular symptoms. Mild weakness. This time I do feel that advance imaging is warranted. Patient to continue all the medications. We will get an MRI. The tendon of findings she may be a candidate for epidural injections. Patient will follow-up after imaging.

## 2016-01-13 ENCOUNTER — Other Ambulatory Visit: Payer: Managed Care, Other (non HMO)

## 2016-01-13 ENCOUNTER — Ambulatory Visit
Admission: RE | Admit: 2016-01-13 | Discharge: 2016-01-13 | Disposition: A | Discharge status: Home / Self Care | Payer: Managed Care, Other (non HMO) | Source: Ambulatory Visit | Attending: Family Medicine | Admitting: Family Medicine

## 2016-01-13 DIAGNOSIS — M5416 Radiculopathy, lumbar region: Secondary | ICD-10-CM

## 2016-02-21 ENCOUNTER — Other Ambulatory Visit: Payer: Self-pay | Admitting: Neurosurgery

## 2016-02-21 DIAGNOSIS — M5126 Other intervertebral disc displacement, lumbar region: Secondary | ICD-10-CM

## 2016-04-18 ENCOUNTER — Ambulatory Visit
Admission: RE | Admit: 2016-04-18 | Discharge: 2016-04-18 | Disposition: A | Discharge status: Home / Self Care | Payer: Managed Care, Other (non HMO) | Source: Ambulatory Visit | Attending: Neurosurgery | Admitting: Neurosurgery

## 2016-04-18 DIAGNOSIS — M5126 Other intervertebral disc displacement, lumbar region: Secondary | ICD-10-CM

## 2016-04-18 MED ORDER — METHYLPREDNISOLONE ACETATE 40 MG/ML INJ SUSP (RADIOLOG
120.0000 mg | Freq: Once | INTRAMUSCULAR | Status: AC
  Administered 2016-04-18: 120 mg via EPIDURAL

## 2016-04-18 MED ORDER — IOPAMIDOL (ISOVUE-M 200) INJECTION 41%
1.0000 mL | Freq: Once | INTRAMUSCULAR | Status: AC
  Administered 2016-04-18: 1 mL via EPIDURAL

## 2016-04-18 NOTE — Discharge Instructions (Signed)

## 2018-03-30 NOTE — Progress Notes (Signed)
Philip Diaz [address] [place], [place] [address] Phone: [phone] Subjective:    I Philip Diaz am serving as a Neurosurgeon for Dr. Antoine Primas.   CC:   Back pain follow-up  [id number]  Philip Diaz is a 38 y.o. male coming in with complaint of back pain. Lower back pain. Right sided pain. Numbness and tingling on the right side.   Onset- Chronic Location- Right sided lower back  Character- sharp  Aggravating factors- Side bending, flexion, certain stretches, sitting to standing     MRI of the spine was done in November 2017.  Pending L4-L5 disc protrusion with right lateral recess stenosis with right L5 nerve impingement.  Patient had an epidural injection February 2018  Past Medical History:  Diagnosis Date  . Blood in stool   . Chicken pox    Past Surgical History:  Procedure Laterality Date  . WISDOM TOOTH EXTRACTION Bilateral    1999   Social History   Socioeconomic History  . Marital status: Single    Spouse name: Not on file  . Number of children: Not on file  . Years of education: Not on file  . Highest education level: Not on file  Occupational History  . Not on file  Social Needs  . Financial resource strain: Not on file  . Food insecurity:    Worry: Not on file    Inability: Not on file  . Transportation needs:    Medical: Not on file    Non-medical: Not on file  Tobacco Use  . Smoking status: Never Smoker  . Smokeless tobacco: Never Used  Substance and Sexual Activity  . Alcohol use: Yes  . Drug use: No  . Sexual activity: Not on file  Lifestyle  . Physical activity:    Days per week: Not on file    Minutes per session: Not on file  . Stress: Not on file  Relationships  . Social connections:    Talks on phone: Not on file    Gets together: Not on file    Attends religious service: Not on file    Active member of club or organization: Not on file    Attends meetings of clubs or organizations: Not  on file    Relationship status: Not on file  Other Topics Concern  . Not on file  Social History Narrative  . Not on file   No Known Allergies Family History  Problem Relation Age of Onset  . Cancer Mother   . Hypertension Mother   . Cancer Father   . Hypertension Father   . Alcohol abuse Maternal Grandmother   . Alcohol abuse Maternal Grandfather   . Alcohol abuse Paternal Grandmother   . Alcohol abuse Paternal Grandfather     Current Outpatient Medications (Endocrine & Metabolic):  .  predniSONE (DELTASONE) 50 MG tablet, Take 1 tablet (50 mg total) by mouth daily.      Current Outpatient Medications (Other):  .  gabapentin (NEURONTIN) 300 MG capsule, Take 1 capsule (300 mg total) by mouth at bedtime. Marland Kitchen  tiZANidine (ZANAFLEX) 4 MG tablet, Take 1 tablet (4 mg total) by mouth every 8 (eight) hours as needed for muscle spasms.    Past medical history, social, surgical and family history all reviewed in electronic medical record.  No pertanent information unless stated regarding to the chief complaint.   Review of Systems:  No headache, visual changes, nausea, vomiting, diarrhea, constipation, dizziness, abdominal pain, skin  rash, fevers, chills, night sweats, weight loss, swollen lymph nodes, body aches, joint swelling, muscle aches, chest pain, shortness of breath, mood changes.   Objective  Blood pressure 110/80, pulse 89, height 6\' 1"  (1.854 m), weight 192 lb (87.1 kg), SpO2 96 %.    General: No apparent distress alert and oriented x3 mood and affect normal, dressed appropriately.  HEENT: Pupils equal, extraocular movements intact  Respiratory: Patient's speak in full sentences and does not appear short of breath  Cardiovascular: No lower extremity edema, non tender, no erythema  Skin: Warm dry intact with no signs of infection or rash on extremities or on axial skeleton.  Abdomen: Soft nontender  Neuro: Cranial nerves II through XII are intact, neurovascularly intact  in all extremities with 2+ DTRs and 2+ pulses.  Lymph: No lymphadenopathy of posterior or anterior cervical chain or axillae bilaterally.  Gait normal with good balance and coordination.  MSK:  Non tender with full range of motion and good stability and symmetric strength and tone of shoulders, elbows, wrist, hip, knee and ankles bilaterally.  Back Exam:  Inspection: Unremarkable  Motion: Flexion 25 deg with worsening symptoms, Extension 15 deg, Side Bending to 35 deg bilaterally,  Rotation to 15 deg bilaterally  SLR laying: Positive right XSLR laying: Positive right Palpable tenderness: Tender to palpation in paraspinal musculature lumbar spine. FABER: Mild tightness of the right. Sensory change: Gross sensation intact to all lumbar and sacral dermatomes.  Reflexes: 2+ at both patellar tendons, 2+ at achilles tendons, Babinski's downgoing.  Strength at foot  Plantar-flexion: 5/5 Dorsi-flexion: 5/5 Eversion: 5/5 Inversion: 5/5  Leg strength  Quad: 5/5 Hamstring: 5/5 Hip flexor: 5/5 Hip abductors: 5/5  Gait unremarkable.     Impression and Recommendations:     This case required medical decision making of moderate complexity. The above documentation has been reviewed and is accurate and complete Philip Saa, DO       Note: This dictation was prepared with Dragon dictation along with smaller phrase technology. Any transcriptional errors that result from this process are unintentional.

## 2018-03-31 ENCOUNTER — Encounter: Payer: Self-pay | Admitting: Family Medicine

## 2018-03-31 ENCOUNTER — Ambulatory Visit (INDEPENDENT_AMBULATORY_CARE_PROVIDER_SITE_OTHER): Payer: Managed Care, Other (non HMO) | Admitting: Family Medicine

## 2018-03-31 VITALS — BP 110/80 | HR 89 | Ht 73.0 in | Wt 192.0 lb

## 2018-03-31 DIAGNOSIS — M5416 Radiculopathy, lumbar region: Secondary | ICD-10-CM | POA: Diagnosis not present

## 2018-03-31 MED ORDER — KETOROLAC TROMETHAMINE 60 MG/2ML IM SOLN
60.0000 mg | Freq: Once | INTRAMUSCULAR | Status: AC
  Administered 2018-03-31: 60 mg via INTRAMUSCULAR

## 2018-03-31 MED ORDER — PREDNISONE 50 MG PO TABS: 50.0000 mg | ORAL_TABLET | Freq: Every day | ORAL | 0 refills | Status: DC

## 2018-03-31 MED ORDER — METHYLPREDNISOLONE ACETATE 80 MG/ML IJ SUSP
80.0000 mg | Freq: Once | INTRAMUSCULAR | Status: AC
  Administered 2018-03-31: 80 mg via INTRAMUSCULAR

## 2018-03-31 NOTE — Patient Instructions (Signed)
Good to see you  Ice 20 minutes 2 times daily. Usually after activity and before bed. 2 injections today  Prednisone daily for 5 days starting tomorrow.  Send me a message on Monday and tell me how you are doing.  If not better we will need to consider injection in your back again  Have an appointment with me again set up in 2-3 weeks just in case.

## 2018-03-31 NOTE — Assessment & Plan Note (Signed)
No exacerbation at this time.  Patient does have a straight leg positive test today.  Discussed with patient about icing regimen, home exercise, discussed that at this point I do think another epidural could be beneficial.  Ordered today.  Short course of prednisone given as well.  Discussed gabapentin and over-the-counter medications.  Patient will follow-up with me again 4 to 6 weeks

## 2018-04-08 ENCOUNTER — Encounter: Payer: Self-pay | Admitting: Family Medicine

## 2018-04-14 NOTE — Progress Notes (Signed)
[facility] [address] [place], [place] [address] Phone: [phone] Subjective:   Philip Diaz, am serving as a scribe for Dr. Antoine Primas.    CC: Back pain follow-up  [id number]    Update 04/15/2018:  Philip Diaz is a 38 y.o. male coming in with complaint of neck pain.  Was having worsening neck pain.  Attempted a epidural at L4-L5 on April 18, 2016 was last time.  Patient like to try more conservative therapy and started on prednisone as well.  Patient is making some mild improvement but did not seem to worsen again. Patient feels that he may have made slight progress. Pain varies from day to day. Still having right glute pain that radiates into the right foot. Is able to perform sit to stand without having pain. Did finish prednisone and feels that it helped his pain at night. Discomfort returned one day after finishing prednisone.       Past Medical History:  Diagnosis Date  . Blood in stool   . Chicken pox    Past Surgical History:  Procedure Laterality Date  . WISDOM TOOTH EXTRACTION Bilateral    1999   Social History   Socioeconomic History  . Marital status: Single    Spouse name: Not on file  . Number of children: Not on file  . Years of education: Not on file  . Highest education level: Not on file  Occupational History  . Not on file  Social Needs  . Financial resource strain: Not on file  . Food insecurity:    Worry: Not on file    Inability: Not on file  . Transportation needs:    Medical: Not on file    Non-medical: Not on file  Tobacco Use  . Smoking status: Never Smoker  . Smokeless tobacco: Never Used  Substance and Sexual Activity  . Alcohol use: Yes  . Drug use: No  . Sexual activity: Not on file  Lifestyle  . Physical activity:    Days per week: Not on file    Minutes per session: Not on file  . Stress: Not on file  Relationships  . Social connections:    Talks on phone: Not on file   Gets together: Not on file    Attends religious service: Not on file    Active member of club or organization: Not on file    Attends meetings of clubs or organizations: Not on file    Relationship status: Not on file  Other Topics Concern  . Not on file  Social History Narrative  . Not on file   No Known Allergies Family History  Problem Relation Age of Onset  . Cancer Mother   . Hypertension Mother   . Cancer Father   . Hypertension Father   . Alcohol abuse Maternal Grandmother   . Alcohol abuse Maternal Grandfather   . Alcohol abuse Paternal Grandmother   . Alcohol abuse Paternal Grandfather     Current Outpatient Medications (Endocrine & Metabolic):  .  predniSONE (DELTASONE) 50 MG tablet, Take 1 tablet (50 mg total) by mouth daily.      Current Outpatient Medications (Other):  .  gabapentin (NEURONTIN) 300 MG capsule, Take 1 capsule (300 mg total) by mouth at bedtime. Marland Kitchen  tiZANidine (ZANAFLEX) 4 MG tablet, Take 1 tablet (4 mg total) by mouth every 8 (eight) hours as needed for muscle spasms.    Past medical history, social, surgical and family  history all reviewed in electronic medical record.  No pertanent information unless stated regarding to the chief complaint.   Review of Systems:  No headache, visual changes, nausea, vomiting, diarrhea, constipation, dizziness, abdominal pain, skin rash, fevers, chills, night sweats, weight loss, swollen lymph nodes, body aches, joint swelling, muscle aches, chest pain, shortness of breath, mood changes.   Objective  There were no vitals taken for this visit. Systems examined below as of    General: No apparent distress alert and oriented x3 mood and affect normal, dressed appropriately.  HEENT: Pupils equal, extraocular movements intact  Respiratory: Patient's speak in full sentences and does not appear short of breath  Cardiovascular: No lower extremity edema, non tender, no erythema  Skin: Warm dry intact with no signs  of infection or rash on extremities or on axial skeleton.  Abdomen: Soft nontender  Neuro: Cranial nerves II through XII are intact, neurovascularly intact in all extremities with 2+ DTRs and 2+ pulses.  Lymph: No lymphadenopathy of posterior or anterior cervical chain or axillae bilaterally.  Gait normal with good balance and coordination.  MSK:  Non tender with full range of motion and good stability and symmetric strength and tone of shoulders, elbows, wrist, hip, knee and ankles bilaterally.  Back Exam:  Inspection: Loss lordosis Motion: Flexion 35 deg, Extension 15 deg, Side Bending to 35 deg bilaterally,  Rotation to 30 deg bilaterally  SLR laying: The right XSLR laying: Negative  Palpable tenderness: Tender to palpation paraspinal musculature right greater than left. FABER: negative. Sensory change: Gross sensation intact to all lumbar and sacral dermatomes.  Reflexes: 2+ at both patellar tendons, 2+ at achilles tendons, Babinski's downgoing.  Strength at foot  Plantar-flexion: 5/5 Dorsi-flexion: 5/5 Eversion: 5/5 Inversion: 5/5  Leg strength  Quad: 5/5 Hamstring: 5/5 Hip flexor: 5/5 Hip abductors: 4/5    Osteopathic findings  T9 extended rotated and side bent left L2 flexed rotated and side bent right Sacrum right on right    Impression and Recommendations:     This case required medical decision making of moderate complexity. The above documentation has been reviewed and is accurate and complete Judi Saa, DO       Note: This dictation was prepared with Dragon dictation along with smaller phrase technology. Any transcriptional errors that result from this process are unintentional.

## 2018-04-15 ENCOUNTER — Ambulatory Visit (INDEPENDENT_AMBULATORY_CARE_PROVIDER_SITE_OTHER)
Admission: RE | Admit: 2018-04-15 | Discharge: 2018-04-15 | Disposition: A | Discharge status: Home / Self Care | Payer: Managed Care, Other (non HMO) | Source: Ambulatory Visit | Attending: Family Medicine | Admitting: Family Medicine

## 2018-04-15 ENCOUNTER — Encounter: Payer: Self-pay | Admitting: Family Medicine

## 2018-04-15 ENCOUNTER — Ambulatory Visit (INDEPENDENT_AMBULATORY_CARE_PROVIDER_SITE_OTHER): Payer: Managed Care, Other (non HMO) | Admitting: Family Medicine

## 2018-04-15 VITALS — BP 132/88 | HR 98 | Ht 73.0 in

## 2018-04-15 DIAGNOSIS — G5701 Lesion of sciatic nerve, right lower limb: Secondary | ICD-10-CM

## 2018-04-15 DIAGNOSIS — M999 Biomechanical lesion, unspecified: Secondary | ICD-10-CM

## 2018-04-15 DIAGNOSIS — M5416 Radiculopathy, lumbar region: Secondary | ICD-10-CM

## 2018-04-15 MED ORDER — MELOXICAM 15 MG PO TABS: 15.0000 mg | ORAL_TABLET | Freq: Every day | ORAL | 0 refills | Status: AC

## 2018-04-15 NOTE — Patient Instructions (Addendum)
Good to see you  Repeat xray downstairs Ice 20 minutes 2 times daily. Usually after activity and before bed. Tried manipulation and I hope it helps Melxoicam daily for 10 days  Then as needed See me again in 4 weeks but send message in 2 weeks giving me update

## 2018-04-15 NOTE — Assessment & Plan Note (Signed)
Started manipulation.  Responding fairly well.  Did not respond as well to the piriformis injection but report of the nerve root injection ergonomics.  Follow-up in 4 to 8 weeks

## 2018-04-15 NOTE — Assessment & Plan Note (Addendum)
Decision today to treat with OMT was based on Physical Exam  After verbal consent patient was treated with HVLA, ME, FPR techniques in  thoracic, lumbar and sacral areas  Patient tolerated the procedure well with improvement in symptoms  Patient given exercises, stretches and lifestyle modifications  See medications in patient instructions if given  Patient will follow up in 4-8 weeks 

## 2018-05-24 ENCOUNTER — Encounter: Payer: Self-pay | Admitting: Family Medicine

## 2018-11-03 IMAGING — MR MR LUMBAR SPINE W/O CM
4 of 5 series · 22 of 48 positions shown · non-contrast
Comparison: Lumbar spine radiographs 08/25/2015

CLINICAL DATA: Lumbar radiculopathy. Low back pain radiating into
the right leg with numbness in the right thigh.

EXAM:
MRI LUMBAR SPINE WITHOUT CONTRAST
TECHNIQUE: Multiplanar, multisequence MR imaging of the lumbar spine was
performed. No intravenous contrast was administered.

[Series 8: T1 · sagittal · 4.0mm · 0.73mm/px · 4 of 15 slices shown (1 of 2)]
[im 1/15]
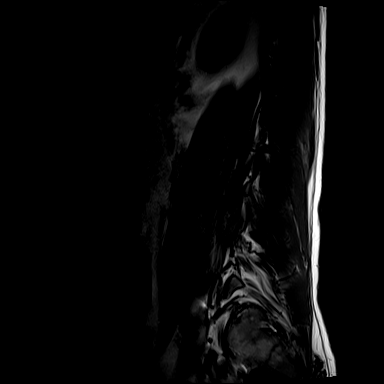
[im 3/15]
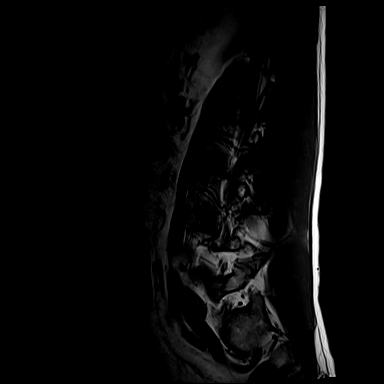
[im 8/15]
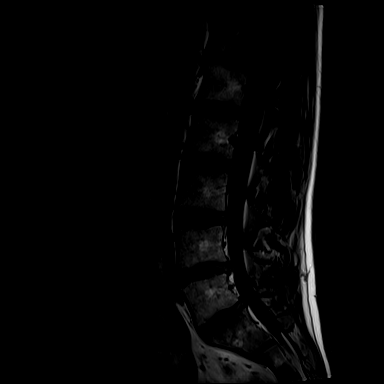
[im 12/15]
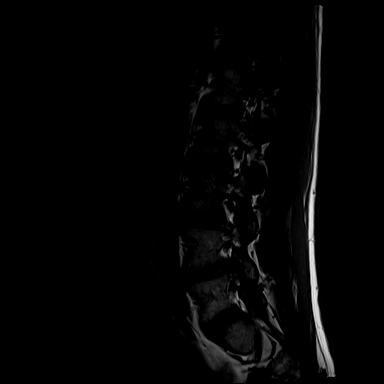

[Series 9: T2 · sagittal · 4.0mm · 0.73mm/px · 7 of 15 slices shown (1 of 2)]
[im 1/15]
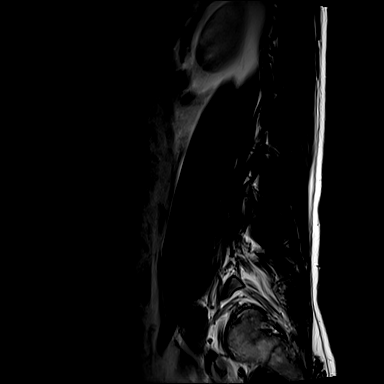
[im 3/15]
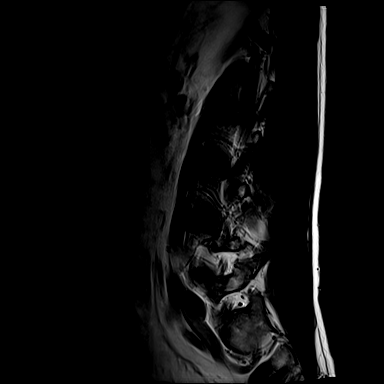
[im 5/15]
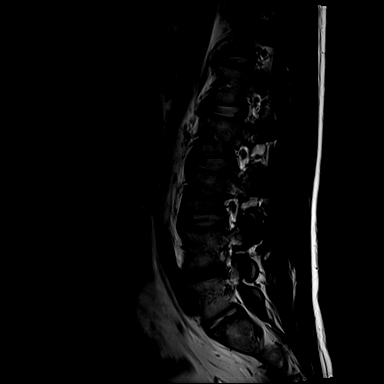
[im 8/15]
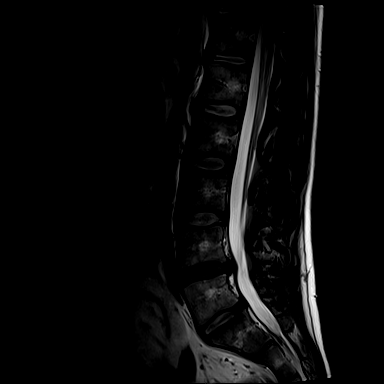
[im 10/15]
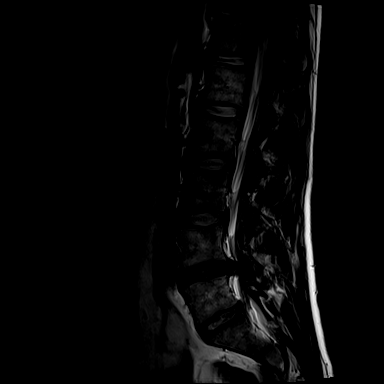
[im 12/15]
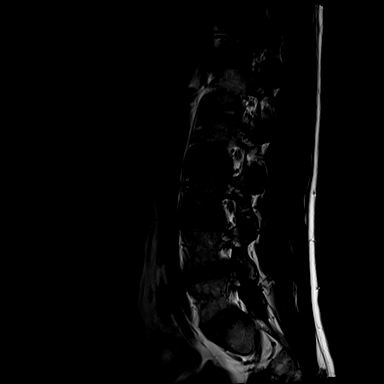
[im 15/15]
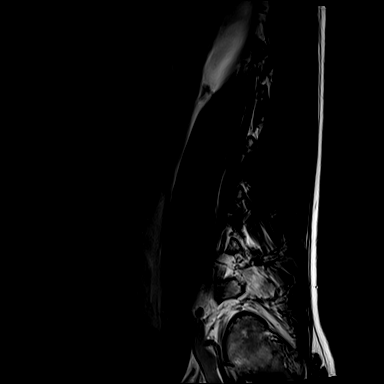

[Series 10: T1 · axial · 4.0mm · 0.56mm/px · z∈[-80,+39]mm · 3 of 29 slices shown (2 of 2)]
[im 5/29]
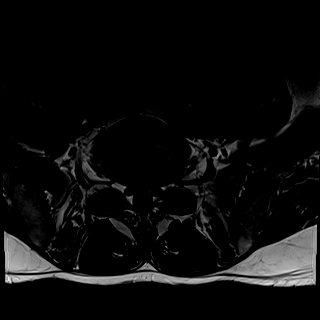
[im 16/29]
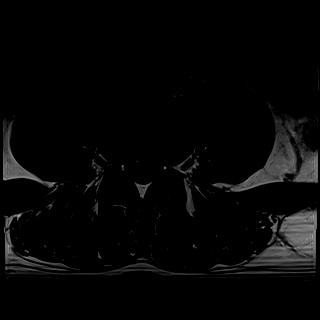
[im 24/29]
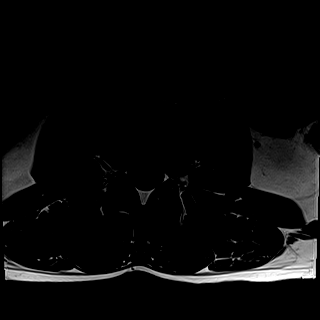

[Series 11: T2 · axial · 4.0mm · 0.28mm/px · z∈[-100,+64]mm · 8 of 29 slices shown (2 of 2)]
[im 1/29]
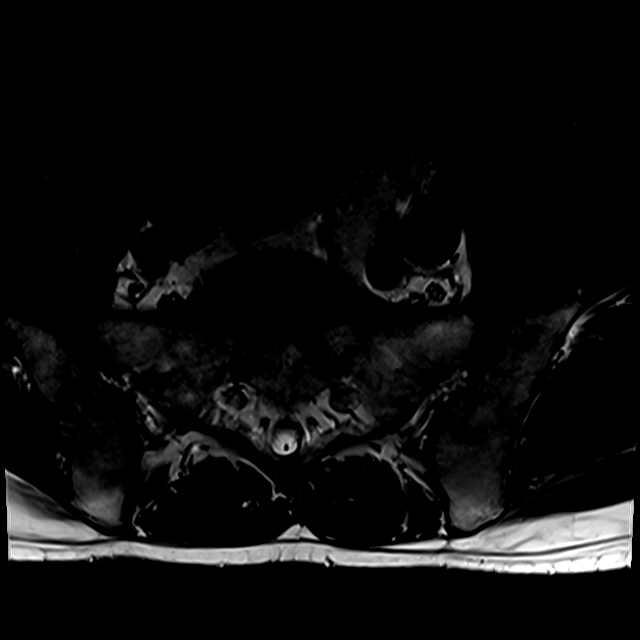
[im 5/29]
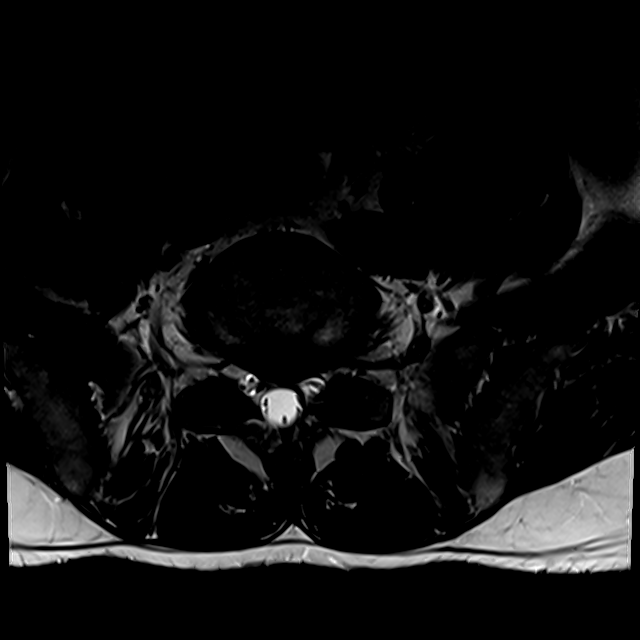
[im 9/29]
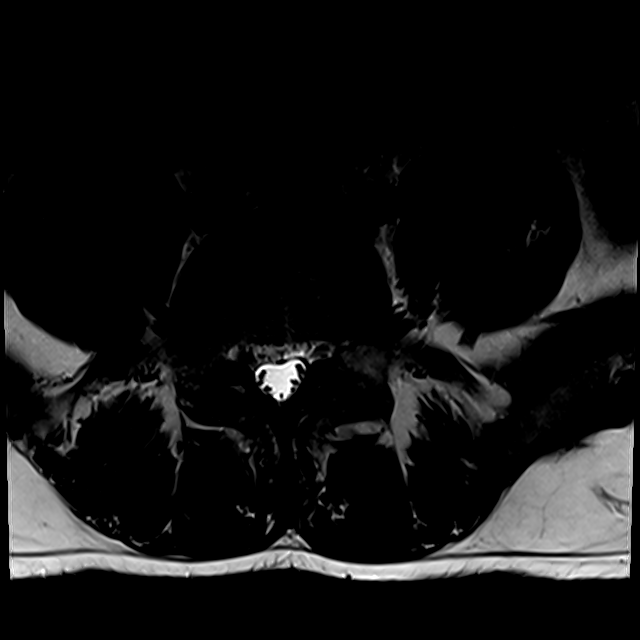
[im 13/29]
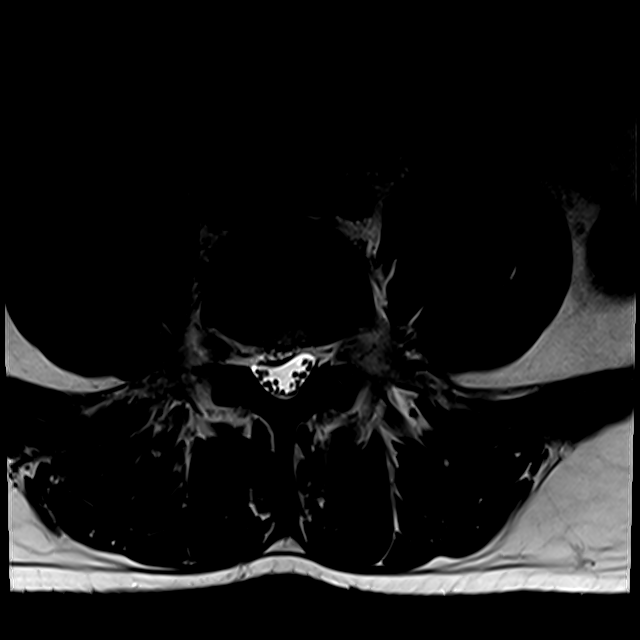
[im 16/29]
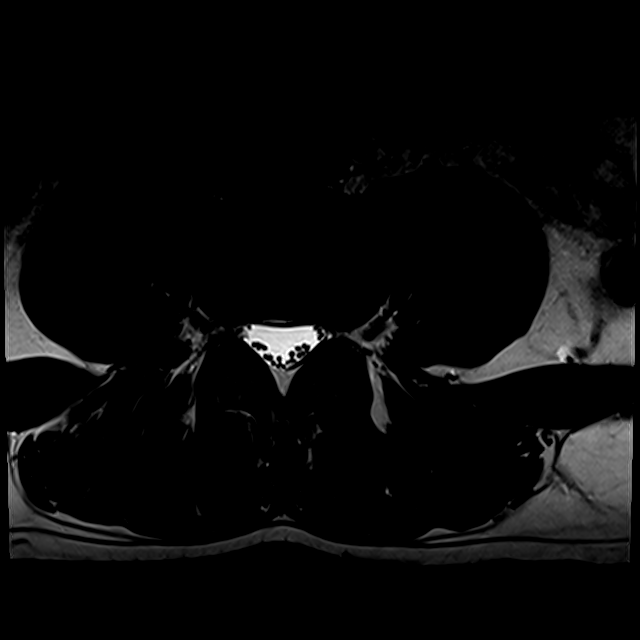
[im 20/29]
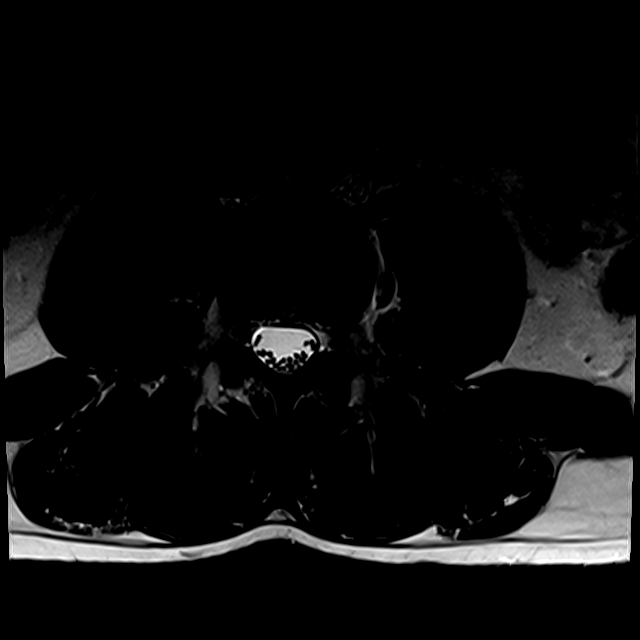
[im 24/29]
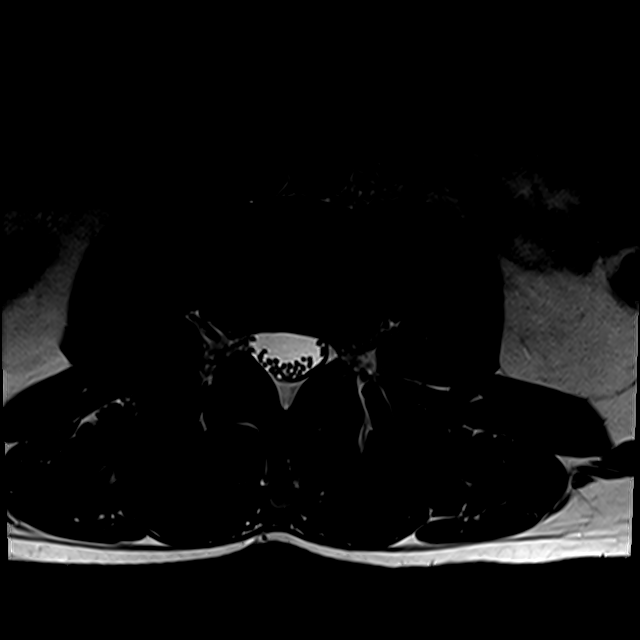
[im 29/29]
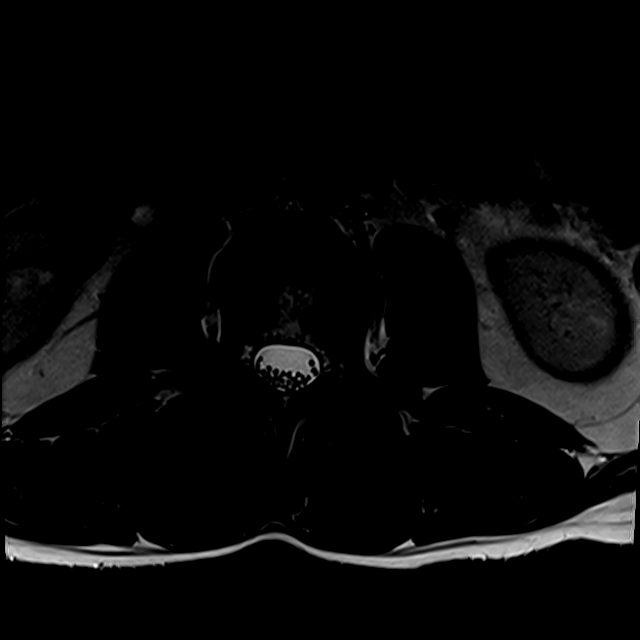

[22 of 48 positions shown; findings below may reference images not displayed]

FINDINGS: Segmentation:  Standard.

Alignment:  Normal.

Vertebrae: Preserved vertebral body heights without evidence of
fracture, osseous lesion, or marrow edema.

Conus medullaris: Extends to the T12-L1 level and appears normal.

Paraspinal and other soft tissues: Unremarkable.

Disc levels:

L1-2: Only imaged sagittally.  Negative.

L2-3:  Negative.

L3-4:  Negative.

L4-5: Disc desiccation and mild disc space narrowing. Moderate-sized
right paracentral disc protrusion with annular fissure results in
mild spinal stenosis and severe right and mild-to-moderate left
lateral recess stenosis. Right L5 nerve root impingement. No
significant neural foraminal stenosis.

L5-S1: No disc herniation or stenosis. Slightly dysplastic
appearance of the right facet joint.
IMPRESSION: L4-5 disc protrusion with severe right lateral recess stenosis and
right L5 nerve impingement.

## 2021-04-03 NOTE — Progress Notes (Signed)
[facility] [place] [facility] Phone: [phone] Subjective:   Fontaine No, am serving as a scribe for Dr. Hulan Diaz. This visit occurred during the SARS-CoV-2 public health emergency.  Safety protocols were in place, including screening questions prior to the visit, additional usage of staff PPE, and extensive cleaning of exam room while observing appropriate contact time as indicated for disinfecting solutions.  I'm seeing this patient by the request  of:  Patient, [name] (Inactive)  CC: ankle pain   [id number]  Last seen 2020 Philip Diaz is a 41 y.o. male coming in with complaint of B achilles pain. Patient states that he backpacked a lot last year. Pain began in Summer of 2022. Continued over 1000 miles after developing pain. Has been biking 2x a week and walks 1 hour daily. L achilles has improved but R continues to hurt. Pain in R ankle is at achilles insertion. Patient has been icing for past 2 weeks and uses [employer] on the trail.      Past Medical History:  Diagnosis Date   Blood in stool    Chicken pox    Past Surgical History:  Procedure Laterality Date   WISDOM TOOTH EXTRACTION Bilateral    1999   Social History   Socioeconomic History   Marital status: Single    Spouse name: Not on file   Number of children: Not on file   Years of education: Not on file   Highest education level: Not on file  Occupational History   Not on file  Tobacco Use   Smoking status: Never   Smokeless tobacco: Never  Substance and Sexual Activity   Alcohol use: Yes   Drug use: No   Sexual activity: Not on file  Other Topics Concern   Not on file  Social History Narrative   Not on file   Social Determinants of Health   Financial Resource Strain: Not on file  Food Insecurity: Not on file  Transportation Needs: Not on file  Physical Activity: Not on file  Stress: Not on file  Social Connections: Not on file    No Known Allergies Family History  Problem Relation Age of Onset   Cancer Mother    Hypertension Mother    Cancer Father    Hypertension Father    Alcohol abuse Maternal Grandmother    Alcohol abuse Maternal Grandfather    Alcohol abuse Paternal Grandmother    Alcohol abuse Paternal Grandfather        Current Outpatient Medications (Analgesics):    meloxicam (MOBIC) 15 MG tablet, Take 1 tablet (15 mg total) by mouth daily.     Reviewed prior external information including notes and imaging from  primary care provider As well as notes that were available from care everywhere and other healthcare systems.  Past medical history, social, surgical and family history all reviewed in electronic medical record.  No pertanent information unless stated regarding to the chief complaint.   Review of Systems:  No headache, visual changes, nausea, vomiting, diarrhea, constipation, dizziness, abdominal pain, skin rash, fevers, chills, night sweats, weight loss, swollen lymph nodes, body aches, joint swelling, chest pain, shortness of breath, mood changes. POSITIVE muscle aches  Objective  Blood pressure 124/84, pulse 65, height 6\' 1"  (1.854 m), weight 209 lb (94.8 kg), SpO2 96 %.   General: No apparent distress alert and oriented x3 mood and affect normal, dressed appropriately.  HEENT: Pupils equal, extraocular movements  intact  Respiratory: Patient's speak in full sentences and does not appear short of breath  Cardiovascular: No lower extremity edema, non tender, no erythema  Gait normal with good balance and coordination.  MSK:   Ankle exam shows mild tightness of the posterior capsule tightness noted on the right.  Mild Haglund nodule noted.  Minimal tenderness noted.  Limited muscular skeletal ultrasound was performed and interpreted by Philip Diaz, M  Patient does have some very mild hypoechoic changes at the insertion of the Achilles.  Patient does have very mild calcific  changes noted as well. Impression: Mild tendinitis  [id number]; 15 additional minutes spent for Therapeutic exercises as stated in above notes.  This included exercises focusing on stretching, strengthening, with significant focus on eccentric aspects.   Long term goals include an improvement in range of motion, strength, endurance as well as avoiding reinjury. Patient's frequency would include in 1-2 times a day, 3-5 times a week for a duration of 6-12 weeks. Ankle strengthening that included:  Basic range of motion exercises to allow proper full motion at ankle Stretching of the lower leg and hamstrings  Theraband exercises for the lower leg - inversion, eversion, dorsiflexion and plantarflexion each to be completed with a theraband Balance exercises to increase proprioception Weight bearing exercises to increase strength and balance   Proper technique shown and discussed handout in great detail with ATC.  All questions were discussed and answered.     Impression and Recommendations:     The above documentation has been reviewed and is accurate and complete Philip Pulley, DO

## 2021-04-04 ENCOUNTER — Encounter: Payer: Self-pay | Admitting: Family Medicine

## 2021-04-04 ENCOUNTER — Ambulatory Visit (INDEPENDENT_AMBULATORY_CARE_PROVIDER_SITE_OTHER): Payer: Managed Care, Other (non HMO) | Admitting: Family Medicine

## 2021-04-04 ENCOUNTER — Ambulatory Visit: Payer: Self-pay

## 2021-04-04 ENCOUNTER — Other Ambulatory Visit: Payer: Self-pay

## 2021-04-04 VITALS — BP 124/84 | HR 65 | Ht 73.0 in | Wt 209.0 lb

## 2021-04-04 DIAGNOSIS — M25571 Pain in right ankle and joints of right foot: Secondary | ICD-10-CM

## 2021-04-04 DIAGNOSIS — M7661 Achilles tendinitis, right leg: Secondary | ICD-10-CM

## 2021-04-04 NOTE — Patient Instructions (Signed)
Brace for R foot Heel lift for L foot Vit D 2000IU daily See me in 5-6 weeks

## 2021-04-05 ENCOUNTER — Encounter: Payer: Self-pay | Admitting: Family Medicine

## 2021-04-05 DIAGNOSIS — M7661 Achilles tendinitis, right leg: Secondary | ICD-10-CM | POA: Insufficient documentation

## 2021-04-05 NOTE — Assessment & Plan Note (Signed)
Right achilles tightness mild calcium. Discussed HEP, brace given, icing, RTC in 6 weeks

## 2021-05-04 NOTE — Progress Notes (Signed)
[facility] [address] [place] [address] Phone: [phone] Subjective:   INadine Counts, am serving as a scribe for Dr. Antoine Primas. This visit occurred during the SARS-CoV-2 public health emergency.  Safety protocols were in place, including screening questions prior to the visit, additional usage of staff PPE, and extensive cleaning of exam room while observing appropriate contact time as indicated for disinfecting solutions.   I'm seeing this patient by the request  of:  Patient, [name] (Inactive)  CC: achilles pain   [id number]  04/04/2021 Right achilles tightness mild calcium. Discussed HEP, brace given, icing, RTC in 6 weeks   Updated 05/07/2021 Philip Diaz is a 41 y.o. male coming in with complaint of achilles pain. Right sided  Pain is about the same as before. The frequency of pain is the same, but the intensity has decreased a bit. No new complaints. Patient states overall is made improvement.      Past Medical History:  Diagnosis Date   Blood in stool    Chicken pox    Past Surgical History:  Procedure Laterality Date   WISDOM TOOTH EXTRACTION Bilateral    1999   Social History   Socioeconomic History   Marital status: Single    Spouse name: Not on file   Number of children: Not on file   Years of education: Not on file   Highest education level: Not on file  Occupational History   Not on file  Tobacco Use   Smoking status: Never   Smokeless tobacco: Never  Substance and Sexual Activity   Alcohol use: Yes   Drug use: No   Sexual activity: Not on file  Other Topics Concern   Not on file  Social History Narrative   Not on file   Social Determinants of Health   Financial Resource Strain: Not on file  Food Insecurity: Not on file  Transportation Needs: Not on file  Physical Activity: Not on file  Stress: Not on file  Social Connections: Not on file   No Known Allergies Family History   Problem Relation Age of Onset   Cancer Mother    Hypertension Mother    Cancer Father    Hypertension Father    Alcohol abuse Maternal Grandmother    Alcohol abuse Maternal Grandfather    Alcohol abuse Paternal Grandmother    Alcohol abuse Paternal Grandfather        Current Outpatient Medications (Analgesics):    meloxicam (MOBIC) 15 MG tablet, Take 1 tablet (15 mg total) by mouth daily.      Review of Systems:  No headache, visual changes, nausea, vomiting, diarrhea, constipation, dizziness, abdominal pain, skin rash, fevers, chills, night sweats, weight loss, swollen lymph nodes, body aches, joint swelling, chest pain, shortness of breath, mood changes. POSITIVE muscle aches  Objective  Blood pressure 122/84, pulse 78, height 6\' 1"  (1.854 m), weight 184 lb (83.5 kg), SpO2 96 %.   General: No apparent distress alert and oriented x3 mood and affect normal, dressed appropriately.  HEENT: Pupils equal, extraocular movements intact  Respiratory: Patient's speak in full sentences and does not appear short of breath  Cardiovascular: No lower extremity edema, non tender, no erythema  MSK: Right ankle exam has good range of motion.  Minimal tenderness noted at the Achilles insertion on the calcaneal region.  5 out of 5 strength on all range of motion.  Limited muscular skeletal ultrasound was performed and interpreted by ,  Yeng Frankie, M  Limited ultrasound of patient's Achilles shows very mild calcific changes at the insertion of the Achilles.  No increase in Doppler flow or hypoechoic changes.  Significant improvement from previous exam  Impression: Interval improvement of the Achilles    Impression and Recommendations:     The above documentation has been reviewed and is accurate and complete Judi Saa, DO

## 2021-05-07 ENCOUNTER — Ambulatory Visit (INDEPENDENT_AMBULATORY_CARE_PROVIDER_SITE_OTHER): Payer: Managed Care, Other (non HMO) | Admitting: Family Medicine

## 2021-05-07 ENCOUNTER — Ambulatory Visit (INDEPENDENT_AMBULATORY_CARE_PROVIDER_SITE_OTHER): Payer: Managed Care, Other (non HMO)

## 2021-05-07 ENCOUNTER — Encounter: Payer: Self-pay | Admitting: Family Medicine

## 2021-05-07 ENCOUNTER — Other Ambulatory Visit: Payer: Self-pay

## 2021-05-07 ENCOUNTER — Ambulatory Visit: Payer: Self-pay

## 2021-05-07 VITALS — BP 122/84 | HR 78 | Ht 73.0 in | Wt 184.0 lb

## 2021-05-07 DIAGNOSIS — M7661 Achilles tendinitis, right leg: Secondary | ICD-10-CM

## 2021-05-07 NOTE — Patient Instructions (Addendum)
Xray today Still baby for another 2 weeks Then can increase as tolerated Avoid box jumps for ever See you again in 3 months

## 2021-05-07 NOTE — Assessment & Plan Note (Signed)
Significant improvement noted.  Almost completely healed at this time.  Discussed icing regimen and home exercises, discussed which activities to do which wants to avoid.  Increase activity as tolerated.  Discussed may be avoiding still jumping or running for another 2 to 4 weeks.  Follow-up with me again in 3 months

## 2021-08-01 NOTE — Progress Notes (Deleted)
[facility] [address] [place] [address] Phone: [phone] Subjective:    I'm seeing this patient by the request  of:  Patient, [name] (Inactive)  CC:   [id number]  05/07/2021 Significant improvement noted.  Almost completely healed at this time.  Discussed icing regimen and home exercises, discussed which activities to do which wants to avoid.  Increase activity as tolerated.  Discussed may be avoiding still jumping or running for another 2 to 4 weeks.  Follow-up with me again in 3 months  Update 08/02/2021 Philip Diaz is a 41 y.o. male coming in with complaint of L achilles pain. Patient states       Past Medical History:  Diagnosis Date   Blood in stool    Chicken pox    Past Surgical History:  Procedure Laterality Date   WISDOM TOOTH EXTRACTION Bilateral    1999   Social History   Socioeconomic History   Marital status: Single    Spouse name: Not on file   Number of children: Not on file   Years of education: Not on file   Highest education level: Not on file  Occupational History   Not on file  Tobacco Use   Smoking status: Never   Smokeless tobacco: Never  Substance and Sexual Activity   Alcohol use: Yes   Drug use: No   Sexual activity: Not on file  Other Topics Concern   Not on file  Social History Narrative   Not on file   Social Determinants of Health   Financial Resource Strain: Not on file  Food Insecurity: Not on file  Transportation Needs: Not on file  Physical Activity: Not on file  Stress: Not on file  Social Connections: Not on file   No Known Allergies Family History  Problem Relation Age of Onset   Cancer Mother    Hypertension Mother    Cancer Father    Hypertension Father    Alcohol abuse Maternal Grandmother    Alcohol abuse Maternal Grandfather    Alcohol abuse Paternal Grandmother    Alcohol abuse Paternal Grandfather        Current Outpatient Medications  (Analgesics):    meloxicam (MOBIC) 15 MG tablet, Take 1 tablet (15 mg total) by mouth daily.     Reviewed prior external information including notes and imaging from  primary care provider As well as notes that were available from care everywhere and other healthcare systems.  Past medical history, social, surgical and family history all reviewed in electronic medical record.  No pertanent information unless stated regarding to the chief complaint.   Review of Systems:  No headache, visual changes, nausea, vomiting, diarrhea, constipation, dizziness, abdominal pain, skin rash, fevers, chills, night sweats, weight loss, swollen lymph nodes, body aches, joint swelling, chest pain, shortness of breath, mood changes. POSITIVE muscle aches  Objective  There were no vitals taken for this visit.   General: No apparent distress alert and oriented x3 mood and affect normal, dressed appropriately.  HEENT: Pupils equal, extraocular movements intact  Respiratory: Patient's speak in full sentences and does not appear short of breath  Cardiovascular: No lower extremity edema, non tender, no erythema  Gait normal with good balance and coordination.  MSK:  Non tender with full range of motion and good stability and symmetric strength and tone of shoulders, elbows, wrist, hip, knee and ankles bilaterally.     Impression and Recommendations:     The above  documentation has been reviewed and is accurate and complete Wilford Grist

## 2021-08-07 ENCOUNTER — Ambulatory Visit: Payer: Managed Care, Other (non HMO) | Admitting: Family Medicine
